# Patient Record
Sex: Male | Born: 1935 | Race: White | Hispanic: No | Marital: Married | Smoking: Former smoker
Health system: Southern US, Community
[De-identification: ages and names within clinical notes are randomized; demographics above are authoritative.]

## PROBLEM LIST (undated history)

## (undated) DIAGNOSIS — G309 Alzheimer's disease, unspecified: Secondary | ICD-10-CM

## (undated) DIAGNOSIS — D469 Myelodysplastic syndrome, unspecified: Secondary | ICD-10-CM

## (undated) DIAGNOSIS — G3184 Mild cognitive impairment, so stated: Secondary | ICD-10-CM

## (undated) DIAGNOSIS — I10 Essential (primary) hypertension: Secondary | ICD-10-CM

## (undated) DIAGNOSIS — I4891 Unspecified atrial fibrillation: Secondary | ICD-10-CM

## (undated) HISTORY — DX: Essential (primary) hypertension: I10

## (undated) HISTORY — DX: Myelodysplastic syndrome, unspecified: D46.9

## (undated) HISTORY — PX: CARDIAC VALVE REPLACEMENT: SHX585

## (undated) HISTORY — DX: Alzheimer's disease, unspecified: G30.9

## (undated) HISTORY — DX: Unspecified atrial fibrillation: I48.91

## (undated) HISTORY — DX: Mild cognitive impairment, so stated: G31.84

---

## 2012-07-16 ENCOUNTER — Ambulatory Visit: Payer: Self-pay | Admitting: Sports Medicine

## 2012-07-16 ENCOUNTER — Encounter: Payer: Self-pay | Admitting: Sports Medicine

## 2012-07-16 ENCOUNTER — Ambulatory Visit (INDEPENDENT_AMBULATORY_CARE_PROVIDER_SITE_OTHER): Payer: Medicare Other | Admitting: Sports Medicine

## 2012-07-16 VITALS — BP 127/78 | HR 65 | Ht 62.0 in | Wt 154.0 lb

## 2012-07-16 DIAGNOSIS — I4891 Unspecified atrial fibrillation: Secondary | ICD-10-CM

## 2012-07-16 DIAGNOSIS — Z299 Encounter for prophylactic measures, unspecified: Secondary | ICD-10-CM

## 2012-07-16 DIAGNOSIS — Z23 Encounter for immunization: Secondary | ICD-10-CM

## 2012-07-16 DIAGNOSIS — Z7901 Long term (current) use of anticoagulants: Secondary | ICD-10-CM | POA: Insufficient documentation

## 2012-07-16 DIAGNOSIS — Z0000 Encounter for general adult medical examination without abnormal findings: Secondary | ICD-10-CM | POA: Insufficient documentation

## 2012-07-16 DIAGNOSIS — I10 Essential (primary) hypertension: Secondary | ICD-10-CM | POA: Insufficient documentation

## 2012-07-16 DIAGNOSIS — Z298 Encounter for other specified prophylactic measures: Secondary | ICD-10-CM

## 2012-07-16 DIAGNOSIS — Z952 Presence of prosthetic heart valve: Secondary | ICD-10-CM | POA: Insufficient documentation

## 2012-07-16 DIAGNOSIS — Z954 Presence of other heart-valve replacement: Secondary | ICD-10-CM

## 2012-07-16 HISTORY — DX: Unspecified atrial fibrillation: I48.91

## 2012-07-16 HISTORY — DX: Essential (primary) hypertension: I10

## 2012-07-16 LAB — POCT INR: INR: 2.7

## 2012-07-16 NOTE — Progress Notes (Signed)
Subjective:    CC: Establish care.   HPI:  Preventive care:  He is due for flu, Tdap, pneumonia shots.  He had his shingles vaccine approximately 5 years ago.  Colonoscopy was approximately 4 years ago.  Hypertension: Well controlled on the current regimen.  Atrial fibrillation: Currently on Coumadin, he is also had a bioprosthetic aortic valve replacement. His son reports that his INRs have been fairly fluctuant. He has not had any episodes of severe bleeding. They have discussed the newer anticoagulants, or unsure as to whether they would like to start it yet or not. His INR today is 2.7, regimen as documented.  Past medical history, Surgical history, Family history, Social history, Allergies, and medications have been entered into the medical record, reviewed, and no changes needed.   Review of Systems: No headache, visual changes, nausea, vomiting, diarrhea, constipation, dizziness, abdominal pain, skin rash, fevers, chills, night sweats, weight loss, chest pain, body aches, joint swelling, muscle aches, or shortness of breath.   Objective:    General: Well Developed, well nourished, and in no acute distress.  Neuro: Alert and oriented x3, extra-ocular muscles intact.  HEENT: Normocephalic, atraumatic, pupils equal round reactive to light, neck supple, no masses, no lymphadenopathy, thyroid nonpalpable. Ears canals clear. Skin: Warm and dry, no rashes noted.  Cardiac: Irregularly irregular rate and rhythm, 3/6 systolic murmur at the left upper sternal border. Respiratory: Clear to auscultation bilaterally. Not using accessory muscles, speaking in full sentences.  Abdominal: Soft, nontender, nondistended, positive bowel sounds, no masses, no organomegaly.  Musculoskeletal: Shoulder, elbow, wrist, hip, knee, ankle stable, and with full range of motion.  Impression and Recommendations:

## 2012-07-16 NOTE — Assessment & Plan Note (Signed)
Currently therapeutic. No changes in his regimen.

## 2012-07-16 NOTE — Assessment & Plan Note (Addendum)
Continue current medications. Checking some CBC, CMET, direct LDL as he is not fasting.

## 2012-07-16 NOTE — Assessment & Plan Note (Signed)
We discussed switching to xarelto today. They will think about it. I think this is prudent, as his INR is tend to fluctuate wildly.

## 2012-07-16 NOTE — Assessment & Plan Note (Signed)
Flu shot, Pneumovax. Shingles vaccine approximately 5 years ago. Tdap is unfortunately on back order. Checking CBC, CMET, direct LDL.  I would like him to come back to see me in 2-3 weeks for a welcome to Medicare visit. I would like to do a Mini-Mental Status exam, EKG, aortic ultrasound as he has been smoking, vision, and hearing.

## 2012-07-17 ENCOUNTER — Encounter: Payer: Self-pay | Admitting: Sports Medicine

## 2012-07-17 LAB — COMPREHENSIVE METABOLIC PANEL
ALT: 20 U/L (ref 0–53)
CO2: 26 mEq/L (ref 19–32)
Calcium: 9.4 mg/dL (ref 8.4–10.5)
Chloride: 102 mEq/L (ref 96–112)
Creat: 1.28 mg/dL (ref 0.50–1.35)
Glucose, Bld: 97 mg/dL (ref 70–99)

## 2012-07-17 LAB — COMPREHENSIVE METABOLIC PANEL WITH GFR
AST: 24 U/L (ref 0–37)
Albumin: 4.5 g/dL (ref 3.5–5.2)
Alkaline Phosphatase: 65 U/L (ref 39–117)
BUN: 25 mg/dL — ABNORMAL HIGH (ref 6–23)
Potassium: 4.7 meq/L (ref 3.5–5.3)
Sodium: 136 meq/L (ref 135–145)
Total Bilirubin: 0.6 mg/dL (ref 0.3–1.2)
Total Protein: 7.2 g/dL (ref 6.0–8.3)

## 2012-07-17 LAB — LDL CHOLESTEROL, DIRECT: Direct LDL: 83 mg/dL

## 2012-07-17 LAB — CBC
HCT: 33.8 % — ABNORMAL LOW (ref 39.0–52.0)
Hemoglobin: 11.2 g/dL — ABNORMAL LOW (ref 13.0–17.0)
MCH: 32.5 pg (ref 26.0–34.0)
MCHC: 33.1 g/dL (ref 30.0–36.0)
MCV: 98 fL (ref 78.0–100.0)
Platelets: 156 10*3/uL (ref 150–400)
RBC: 3.45 MIL/uL — ABNORMAL LOW (ref 4.22–5.81)
RDW: 13.7 % (ref 11.5–15.5)
WBC: 6.4 10*3/uL (ref 4.0–10.5)

## 2012-07-29 ENCOUNTER — Encounter: Payer: Self-pay | Admitting: *Deleted

## 2012-07-29 ENCOUNTER — Telehealth: Payer: Self-pay | Admitting: *Deleted

## 2012-07-29 ENCOUNTER — Encounter: Payer: Self-pay | Admitting: Sports Medicine

## 2012-07-29 ENCOUNTER — Ambulatory Visit (INDEPENDENT_AMBULATORY_CARE_PROVIDER_SITE_OTHER): Payer: Medicare Other | Admitting: Sports Medicine

## 2012-07-29 VITALS — BP 149/79 | HR 62 | Wt 154.0 lb

## 2012-07-29 DIAGNOSIS — I4891 Unspecified atrial fibrillation: Secondary | ICD-10-CM

## 2012-07-29 DIAGNOSIS — Z0000 Encounter for general adult medical examination without abnormal findings: Secondary | ICD-10-CM

## 2012-07-29 DIAGNOSIS — G3184 Mild cognitive impairment, so stated: Secondary | ICD-10-CM

## 2012-07-29 DIAGNOSIS — Z299 Encounter for prophylactic measures, unspecified: Secondary | ICD-10-CM

## 2012-07-29 DIAGNOSIS — Z298 Encounter for other specified prophylactic measures: Secondary | ICD-10-CM

## 2012-07-29 DIAGNOSIS — G309 Alzheimer's disease, unspecified: Secondary | ICD-10-CM

## 2012-07-29 DIAGNOSIS — G459 Transient cerebral ischemic attack, unspecified: Secondary | ICD-10-CM | POA: Insufficient documentation

## 2012-07-29 DIAGNOSIS — Z2989 Encounter for other specified prophylactic measures: Secondary | ICD-10-CM

## 2012-07-29 DIAGNOSIS — F0670 Mild neurocognitive disorder due to known physiological condition without behavioral disturbance: Secondary | ICD-10-CM | POA: Insufficient documentation

## 2012-07-29 DIAGNOSIS — Z23 Encounter for immunization: Secondary | ICD-10-CM

## 2012-07-29 HISTORY — DX: Alzheimer's disease, unspecified: G30.9

## 2012-07-29 MED ORDER — DONEPEZIL HCL 5 MG PO TABS: 5.0000 mg | ORAL_TABLET | Freq: Every day | ORAL | Status: DC

## 2012-07-29 MED ORDER — RIVAROXABAN 20 MG PO TABS: 20.0000 mg | ORAL_TABLET | Freq: Every day | ORAL | Status: DC

## 2012-07-29 NOTE — Assessment & Plan Note (Addendum)
Switching to xarelto.  Risks and benefits explained including decreased risk of both life-threatening, and mild bleeding. We also discussed that there was no antidote at this time. Discontinue Coumadin. Discontinue all INR checks.

## 2012-07-29 NOTE — Assessment & Plan Note (Addendum)
Did very well on Mini-Mental Status exam. Going to obtain a dementia panel. We'll start Aricept. I will see him back in 3 months, and we can increase to 10 mg if needed.

## 2012-07-29 NOTE — Progress Notes (Signed)
  Subjective:    Benjamin Fowler is a 78 y.o. male who presents for a welcome to Medicare exam.   Cardiac risk factors: advanced age (older than 30 for men, 68 for women).  Depression Screen (Note: if answer to either of the following is "Yes", a more complete depression screening is indicated)  Q1: Over the past two weeks, have you felt down, depressed or hopeless? no Q2: Over the past two weeks, have you felt little interest or pleasure in doing things? no  Activities of Daily Living In your present state of health, do you have any difficulty performing the following activities?:  Preparing food and eating?: No Bathing yourself: No Getting dressed: No Using the toilet:No Moving around from place to place: No In the past year have you fallen or had a near fall?:No  Current exercise habits: walks  Dietary issues discussed: Yes  Hearing difficulties: No Safe in current home environment: yes  The would also like to discuss:  Cognitive impairment: Benjamin Fowler does tend to forget things quite often, he does most of his finances, he does tend to hallucinate at night, he sees demons. Otherwise, he does all of his other activities of daily living by himself.  Facial droop: This happens infrequently, but his son recognizes this as he does tend to drool occasionally.  He also notes that his speech slurs. He is wondering if we can look into a stroke workup.  Atrial fibrillation: Currently anticoagulated with Coumadin, they do desire to switch to xarelto.    Review of Systems A comprehensive review of systems was negative.    Objective:     Blood pressure 149/79, pulse 62, weight 154 lb (69.854 kg). There is no height on file to calculate BMI. General Appearance: Alert, well developed and well nourished, cooperative, no distress.  Head: Normocephalic, no obvious abnormality  Eyes: PERRL, EOM's intact, conjunctiva and corneas clear.  Nose: Nares symmetrical, septum midline, mucosa pink,  clear watery discharge; no sinus tenderness  Throat: Lips, tongue, and mucosa are moist, pink, and intact; teeth intact  Neck: Supple, symmetrical, trachea midline, no adenopathy; thyroid: no enlargement, symmetric,no tenderness/mass/nodules; no carotid bruit, no JVD  Back: Symmetrical, no curvature, ROM normal, no CVA tenderness  Chest/Breast: No mass or tenderness  Lungs: Clear to auscultation bilaterally, respirations unlabored  Heart: Normal PMI, no lower extremity edema, regular rate & rhythm, S1 and S2 normal, no murmurs, rubs, or gallops. Abdomen: Soft, non-tender, bowel sounds active all four quadrants, no mass, or organomegaly  Musculoskeletal: All joints, extremities examined, non-tender and unremarkable.  Lymphatic: No adenopathy  Skin/Hair/Nails: Skin warm, dry, and intact, no rashes or abnormal dyspigmentation  Neurologic: Alert and oriented x3, no cranial nerve deficits, normal strength and tone, gait steady   Mini-Mental Status exam: 30     Assessment:       1. Mild cognitive impairment. 2. Possible CVAs. 3. Anticoagulation.    Plan:     During the course of the visit the patient was educated and counseled about appropriate screening and preventive services including:   Td vaccine  Patient Instructions (the written plan) was given to the patient.

## 2012-07-29 NOTE — Assessment & Plan Note (Signed)
Symptoms are concerning. We'll obtain an MRI of the brain. Carotid Dopplers. We'll keep blood pressure under tight control. We will also continue anticoagulation with xarelto.

## 2012-07-30 LAB — CBC
HCT: 34.3 % — ABNORMAL LOW (ref 39.0–52.0)
Hemoglobin: 11.1 g/dL — ABNORMAL LOW (ref 13.0–17.0)
MCH: 32.7 pg (ref 26.0–34.0)
MCHC: 32.4 g/dL (ref 30.0–36.0)
MCV: 101.2 fL — ABNORMAL HIGH (ref 78.0–100.0)
Platelets: 153 K/uL (ref 150–400)
RBC: 3.39 MIL/uL — ABNORMAL LOW (ref 4.22–5.81)
RDW: 14 % (ref 11.5–15.5)
WBC: 5.5 10*3/uL (ref 4.0–10.5)

## 2012-07-30 LAB — FOLATE: Folate: >20 ng/mL

## 2012-07-30 LAB — RPR

## 2012-07-30 LAB — TSH: TSH: 3.722 u[IU]/mL (ref 0.350–4.500)

## 2012-07-30 LAB — VITAMIN B12: Vitamin B-12: 1341 pg/mL — ABNORMAL HIGH (ref 211–911)

## 2012-07-30 LAB — SEDIMENTATION RATE: Sed Rate: 11 mm/hr (ref 0–16)

## 2012-08-18 ENCOUNTER — Ambulatory Visit (HOSPITAL_BASED_OUTPATIENT_CLINIC_OR_DEPARTMENT_OTHER)
Admission: RE | Admit: 2012-08-18 | Discharge: 2012-08-18 | Disposition: A | Discharge status: Home / Self Care | Payer: Medicare Other | Source: Ambulatory Visit | Attending: Sports Medicine | Admitting: Sports Medicine

## 2012-08-18 DIAGNOSIS — G3184 Mild cognitive impairment, so stated: Secondary | ICD-10-CM | POA: Insufficient documentation

## 2012-08-18 DIAGNOSIS — I6529 Occlusion and stenosis of unspecified carotid artery: Secondary | ICD-10-CM | POA: Insufficient documentation

## 2012-08-18 DIAGNOSIS — G319 Degenerative disease of nervous system, unspecified: Secondary | ICD-10-CM | POA: Insufficient documentation

## 2012-08-18 DIAGNOSIS — I10 Essential (primary) hypertension: Secondary | ICD-10-CM | POA: Insufficient documentation

## 2012-08-18 DIAGNOSIS — E237 Disorder of pituitary gland, unspecified: Secondary | ICD-10-CM | POA: Insufficient documentation

## 2012-10-23 ENCOUNTER — Other Ambulatory Visit: Payer: Self-pay | Admitting: *Deleted

## 2012-10-23 DIAGNOSIS — G3184 Mild cognitive impairment, so stated: Secondary | ICD-10-CM

## 2012-10-23 MED ORDER — DONEPEZIL HCL 5 MG PO TABS: 5.0000 mg | ORAL_TABLET | Freq: Every day | ORAL | Status: DC

## 2012-10-23 MED ORDER — ENALAPRIL MALEATE 5 MG PO TABS: 5.0000 mg | ORAL_TABLET | Freq: Two times a day (BID) | ORAL | Status: DC

## 2012-10-29 ENCOUNTER — Encounter: Payer: Self-pay | Admitting: Sports Medicine

## 2012-10-29 ENCOUNTER — Ambulatory Visit (INDEPENDENT_AMBULATORY_CARE_PROVIDER_SITE_OTHER): Payer: Medicare Other | Admitting: Sports Medicine

## 2012-10-29 VITALS — BP 120/67 | HR 84 | Wt 151.0 lb

## 2012-10-29 DIAGNOSIS — G3184 Mild cognitive impairment, so stated: Secondary | ICD-10-CM

## 2012-10-29 DIAGNOSIS — I4891 Unspecified atrial fibrillation: Secondary | ICD-10-CM

## 2012-10-29 DIAGNOSIS — I10 Essential (primary) hypertension: Secondary | ICD-10-CM

## 2012-10-29 MED ORDER — DONEPEZIL HCL 10 MG PO TABS: 10.0000 mg | ORAL_TABLET | Freq: Every day | ORAL | Status: DC

## 2012-10-29 NOTE — Assessment & Plan Note (Signed)
Doing much better with Aricept, less hallucinations at night. I am going to increase his Aricept to 10 mg daily.

## 2012-10-29 NOTE — Assessment & Plan Note (Addendum)
Continue xarelto. Currently euvolemic on exam. He does have a followup with a cardiologist in [place]. He does have what  sounds to be an aortic stenosis murmur.

## 2012-10-29 NOTE — Assessment & Plan Note (Signed)
Well-controlled, no changes needed 

## 2012-10-29 NOTE — Progress Notes (Signed)
Subjective:    CC: Followup  HPI: Atrial fibrillation: He does have a visit coming up with his cardiologist in [place]. He's doing very well on the xarelto, and is enjoying not having to come in for Coumadin checks. He denies any episodes of bleeding.  Hypertension: Doing well with current medications, no adverse effects, needs no refills.  Dementia: Is doing well with Aricept, his son notes that he sees less demons at night, and is somewhat more appropriately interactive.  Past medical history, Surgical history, Family history, Social history, Allergies, and medications have been entered into the medical record, reviewed, and no changes needed.   Review of Systems: No fevers, chills, night sweats, weight loss, chest pain, or shortness of breath.   Objective:    General: Well Developed, well nourished, and in no acute distress.  Neuro: Alert and oriented x3, extra-ocular muscles intact, sensation grossly intact.  HEENT: Normocephalic, atraumatic, pupils equal round reactive to light, neck supple, no masses, no lymphadenopathy, thyroid nonpalpable.  Skin: Warm and dry, no rashes. Cardiac: Irregularly irregular rhythm, normal rate, 2/6 systolic ejection murmur heard over the aorta. No lower extremity edema present. Respiratory: Clear to auscultation bilaterally. Not using accessory muscles, speaking in full sentences.  Impression and Recommendations:

## 2013-01-27 ENCOUNTER — Ambulatory Visit: Payer: Medicare Other | Admitting: Sports Medicine

## 2013-01-29 ENCOUNTER — Ambulatory Visit (INDEPENDENT_AMBULATORY_CARE_PROVIDER_SITE_OTHER): Payer: Medicare Other | Admitting: Sports Medicine

## 2013-01-29 ENCOUNTER — Encounter: Payer: Self-pay | Admitting: Sports Medicine

## 2013-01-29 VITALS — BP 134/80 | HR 62 | Wt 148.0 lb

## 2013-01-29 DIAGNOSIS — I4891 Unspecified atrial fibrillation: Secondary | ICD-10-CM

## 2013-01-29 DIAGNOSIS — G3184 Mild cognitive impairment, so stated: Secondary | ICD-10-CM

## 2013-01-29 DIAGNOSIS — I10 Essential (primary) hypertension: Secondary | ICD-10-CM

## 2013-01-29 MED ORDER — CITALOPRAM HYDROBROMIDE 20 MG PO TABS: 20.0000 mg | ORAL_TABLET | Freq: Every day | ORAL | Status: DC

## 2013-01-29 MED ORDER — RIVAROXABAN 20 MG PO TABS: 20.0000 mg | ORAL_TABLET | Freq: Every day | ORAL | Status: DC

## 2013-01-29 MED ORDER — METOPROLOL TARTRATE 25 MG PO TABS: 25.0000 mg | ORAL_TABLET | Freq: Two times a day (BID) | ORAL | Status: DC

## 2013-01-29 MED ORDER — ENALAPRIL MALEATE 5 MG PO TABS: 5.0000 mg | ORAL_TABLET | Freq: Two times a day (BID) | ORAL | Status: DC

## 2013-01-29 NOTE — Assessment & Plan Note (Addendum)
Continue Aricept, no changes. Son does feel as though his father is depressed, I am adding a low dose of Celexa. Come back in 2-4 weeks to reassess.  Certainly we can also consider the addition of Namenda.

## 2013-01-29 NOTE — Patient Instructions (Addendum)
Dementia Dementia is a general term for problems with brain function. A person with dementia has memory loss and a hard time with at least one other brain function such as thinking, speaking, or problem solving. Dementia can affect social functioning, how you do your job, your mood, or your personality. The changes may be hidden for a long time. The earliest forms of this disease are usually not detected by family or friends. Dementia can be:  Irreversible.  Potentially reversible.  Partially reversible.  Progressive. This means it can get worse over time. CAUSES  Irreversible dementia causes may include:  Degeneration of brain cells (Alzheimer's disease or lewy body dementia).  Multiple small strokes (vascular dementia).  Infection (chronic meningitis or Creutzfelt-Jakob disease).  Frontotemporal dementia. This affects younger people, age 40 to 70, compared to those who have Alzheimer's disease.  Dementia associated with other disorders like Parkinson's disease, Huntington's disease, or HIV-associated dementia. Potentially or partially reversible dementia causes may include:  Medicines.  Metabolic causes such as excessive alcohol intake, vitamin B12 deficiency, or thyroid disease.  Masses or pressure in the brain such as a tumor, blood clot, or hydrocephalus. SYMPTOMS  Symptoms are often hard to detect. Family members or coworkers may not notice them early in the disease process. Different people with dementia may have different symptoms. Symptoms can include:  A hard time with memory, especially recent memory. Long-term memory may not be impaired.  Asking the same question multiple times or forgetting something someone just said.  A hard time speaking your thoughts or finding certain words.  A hard time solving problems or performing familiar tasks (such as how to use a telephone).  Sudden changes in mood.  Changes in personality, especially increasing moodiness or  mistrust.  Depression.  A hard time understanding complex ideas that were never a problem in the past. DIAGNOSIS  There are no specific tests for dementia.   Your caregiver may recommend a thorough evaluation. This is because some forms of dementia can be reversible. The evaluation will likely include a physical exam and getting a detailed history from you and a family member. The history often gives the best clues and suggestions for a diagnosis.  Memory testing may be done. A detailed brain function evaluation called neuropsychologic testing may be helpful.  Lab tests and brain imaging (such as a CT scan or MRI scan) are sometimes important.  Sometimes observation and re-evaluation over time is very helpful. TREATMENT  Treatment depends on the cause.   If the problem is a vitamin deficiency, it may be helped or cured with supplements.  For dementias such as Alzheimer's disease, medicines are available to stabilize or slow the course of the disease. There are no cures for this type of dementia.  Your caregiver can help direct you to groups, organizations, and other caregivers to help with decisions in the care of you or your loved one. HOME CARE INSTRUCTIONS The care of individuals with dementia is varied and dependent upon the progression of the dementia. The following suggestions are intended for the person living with, or caring for, the person with dementia.  Create a safe environment.  Remove the locks on bathroom doors to prevent the person from accidentally locking himself or herself in.  Use childproof latches on kitchen cabinets and any place where cleaning supplies, chemicals, or alcohol are kept.  Use childproof covers in unused electrical outlets.  Install childproof devices to keep doors and windows secured.  Remove stove knobs or install safety   Use childproof latches on kitchen cabinets and any place where cleaning supplies, chemicals, or alcohol are kept.   Use childproof covers in unused electrical outlets.   Install childproof devices to keep doors and windows secured.   Remove stove knobs or install safety knobs and an automatic shut-off on the stove.   Lower the temperature on water heaters.   Label medicines and keep them  locked up.   Secure knives, lighters, matches, power tools, and guns, and keep these items out of reach.   Keep the house free from clutter. Remove rugs or anything that might contribute to a fall.   Remove objects that might break and hurt the person.   Make sure lighting is good, both inside and outside.   Install grab rails as needed.   Use a monitoring device to alert you to falls or other needs for help.   Reduce confusion.   Keep familiar objects and people around.   Use night lights or dim lights at night.   Label items or areas.   Use reminders, notes, or directions for daily activities or tasks.   Keep a simple, consistent routine for waking, meals, bathing, dressing, and bedtime.   Create a calm, quiet environment.   Place large clocks and calendars prominently.   Display emergency numbers and home address near all telephones.   Use cues to establish different times of the day. An example is to open curtains to let the natural light in during the day.    Use effective communication.   Choose simple words and short sentences.   Use a gentle, calm tone of voice.   Be careful not to interrupt.   If the person is struggling to find a word or communicate a thought, try to provide the word or thought.   Ask one question at a time. Allow the person ample time to answer questions. Repeat the question again if the person does not respond.   Reduce nighttime restlessness.   Provide a comfortable bed.   Have a consistent nighttime routine.   Ensure a regular walking or physical activity schedule. Involve the person in daily activities as much as possible.   Limit napping during the day.   Limit caffeine.   Attend social events that stimulate rather than overwhelm the senses.   Encourage good nutrition and hydration.   Reduce distractions during meal times and snacks.   Avoid foods that are too hot or too cold.   Monitor chewing and swallowing ability.   Continue with routine vision,  hearing, dental, and medical screenings.   Only give over-the-counter or prescription medicines as directed by the caregiver.   Monitor driving abilities. Do not allow the person to drive when safe driving is no longer possible.   Register with an identification program which could provide location assistance in the event of a missing person situation.  SEEK MEDICAL CARE IF:    New behavioral problems start such as moodiness, aggressiveness, or seeing things that are not there (hallucinations).   Any new problem with brain function happens. This includes problems with balance, speech, or falling a lot.   Problems with swallowing develop.   Any symptoms of other illness happen.  Small changes or worsening in any aspect of brain function can be a sign that the illness is getting worse. It can also be a sign of another medical illness such as infection. Seeing a caregiver right away is important.  SEEK IMMEDIATE MEDICAL CARE IF:   

## 2013-01-29 NOTE — Assessment & Plan Note (Signed)
Well controlled, no changes 

## 2013-01-29 NOTE — Progress Notes (Signed)
  Subjective:    CC: Followup  HPI: Hypertension: Well controlled.  Atrial fibrillation: No lightheadedness, palpitations, chest pain, no bleeding with xarelto.  Mild cognitive impairment: Stable on Aricept.  Depressed mood: Was having some sleep disturbances at a prior visit, did have some occasional hallucinations. Today notes that her mood is sometimes depressed, energy level is low, does have some difficulty getting to sleep, and appetite has been decreased for greater than 2 weeks. No suicidal or homicidal ideation.  Past medical history, Surgical history, Family history not pertinant except as noted below, Social history, Allergies, and medications have been entered into the medical record, reviewed, and no changes needed.   Review of Systems: No fevers, chills, night sweats, weight loss, chest pain, or shortness of breath.   Objective:    General: Well Developed, well nourished, and in no acute distress.  Neuro: Alert and oriented x3, extra-ocular muscles intact, sensation grossly intact.  HEENT: Normocephalic, atraumatic, pupils equal round reactive to light, neck supple, no masses, no lymphadenopathy, thyroid nonpalpable.  Skin: Warm and dry, no rashes. Cardiac: Regular rate and rhythm, no murmurs rubs or gallops. No lower extremity edema. Respiratory: Clear to auscultation bilaterally. Not using accessory muscles, speaking in full sentences. Impression and Recommendations:

## 2013-01-29 NOTE — Assessment & Plan Note (Signed)
Continue xarelto, no changes.

## 2013-02-26 ENCOUNTER — Ambulatory Visit: Payer: Medicare Other | Admitting: Sports Medicine

## 2013-05-11 ENCOUNTER — Encounter: Payer: Self-pay | Admitting: Sports Medicine

## 2013-05-11 ENCOUNTER — Ambulatory Visit (INDEPENDENT_AMBULATORY_CARE_PROVIDER_SITE_OTHER): Payer: Medicare Other | Admitting: Sports Medicine

## 2013-05-11 VITALS — BP 158/71 | HR 67 | Wt 146.0 lb

## 2013-05-11 DIAGNOSIS — I4891 Unspecified atrial fibrillation: Secondary | ICD-10-CM

## 2013-05-11 DIAGNOSIS — G3184 Mild cognitive impairment, so stated: Secondary | ICD-10-CM

## 2013-05-11 DIAGNOSIS — I10 Essential (primary) hypertension: Secondary | ICD-10-CM

## 2013-05-11 NOTE — Assessment & Plan Note (Signed)
Elevated, no significant changes today since blood pressure has been well-controlled her previous visits.

## 2013-05-11 NOTE — Progress Notes (Signed)
  Subjective:    CC: Followup  HPI: Mood disorder: Benjamin Fowler stopped his citalopram after 2 days, he feels happy, and has no complaints, his son is amenable to him stopping the medication.  Dementia: Stable on Aricept, he's had no further hallucinations.  Hypertension: Slightly elevated today, no chest pain, visual changes.  Atrial fibrillation: No episodes of bleeding on xarelto.  Past medical history, Surgical history, Family history not pertinant except as noted below, Social history, Allergies, and medications have been entered into the medical record, reviewed, and no changes needed.   Review of Systems: No fevers, chills, night sweats, weight loss, chest pain, or shortness of breath.   Objective:    General: Well Developed, well nourished, and in no acute distress.  Neuro: Alert and oriented x3, extra-ocular muscles intact, sensation grossly intact.  HEENT: Normocephalic, atraumatic, pupils equal round reactive to light, neck supple, no masses, no lymphadenopathy, thyroid nonpalpable.  Skin: Warm and dry, no rashes. Cardiac: Regular rate and rhythm, no murmurs rubs or gallops, no lower extremity edema.  Respiratory: Clear to auscultation bilaterally. Not using accessory muscles, speaking in full sentences. Impression and Recommendations:

## 2013-05-11 NOTE — Assessment & Plan Note (Signed)
Continue Aricept, this is improved his sleep, and nighttime hallucinations significantly. No longer taking citalopram.

## 2013-05-11 NOTE — Assessment & Plan Note (Signed)
Continue xarelto, enalapril, metoprolol.

## 2013-10-25 ENCOUNTER — Other Ambulatory Visit: Payer: Self-pay | Admitting: *Deleted

## 2013-10-25 DIAGNOSIS — G3184 Mild cognitive impairment, so stated: Secondary | ICD-10-CM

## 2013-10-25 MED ORDER — DONEPEZIL HCL 10 MG PO TABS: 10.0000 mg | ORAL_TABLET | Freq: Every day | ORAL | Status: DC

## 2013-11-09 ENCOUNTER — Ambulatory Visit: Payer: Medicare Other | Admitting: Sports Medicine

## 2013-11-25 ENCOUNTER — Encounter: Payer: Self-pay | Admitting: Sports Medicine

## 2013-11-25 ENCOUNTER — Ambulatory Visit (INDEPENDENT_AMBULATORY_CARE_PROVIDER_SITE_OTHER): Payer: Medicare Other | Admitting: Sports Medicine

## 2013-11-25 VITALS — BP 126/69 | HR 67 | Ht 62.0 in | Wt 138.0 lb

## 2013-11-25 DIAGNOSIS — I4891 Unspecified atrial fibrillation: Secondary | ICD-10-CM

## 2013-11-25 DIAGNOSIS — I10 Essential (primary) hypertension: Secondary | ICD-10-CM

## 2013-11-25 DIAGNOSIS — G3184 Mild cognitive impairment, so stated: Secondary | ICD-10-CM

## 2013-11-25 MED ORDER — DONEPEZIL HCL 10 MG PO TABS: 10.0000 mg | ORAL_TABLET | Freq: Every day | ORAL | Status: DC

## 2013-11-25 MED ORDER — RIVAROXABAN 20 MG PO TABS: 20.0000 mg | ORAL_TABLET | Freq: Every day | ORAL | Status: DC

## 2013-11-25 MED ORDER — METOPROLOL TARTRATE 25 MG PO TABS: 25.0000 mg | ORAL_TABLET | Freq: Two times a day (BID) | ORAL | Status: DC

## 2013-11-25 MED ORDER — ENALAPRIL MALEATE 5 MG PO TABS: 5.0000 mg | ORAL_TABLET | Freq: Two times a day (BID) | ORAL | Status: DC

## 2013-11-25 MED ORDER — CALCIUM CARBONATE-VITAMIN D 600-400 MG-UNIT PO TABS: 1.0000 | ORAL_TABLET | Freq: Two times a day (BID) | ORAL | Status: DC

## 2013-11-25 NOTE — Assessment & Plan Note (Signed)
Well controlled, no changes 

## 2013-11-25 NOTE — Assessment & Plan Note (Signed)
Stable, continue current medications. Refilling xarelto.

## 2013-11-25 NOTE — Progress Notes (Signed)
  Subjective:    CC: Followup  HPI: Dementia: Stable and doing extremely well with Aricept, no further hallucinations.  Hypertension: Well controlled, stable.  Atrial fibrillation: Stable on xarelto. Needs refills on everything.  Past medical history, Surgical history, Family history not pertinant except as noted below, Social history, Allergies, and medications have been entered into the medical record, reviewed, and no changes needed.   Review of Systems: No fevers, chills, night sweats, weight loss, chest pain, or shortness of breath.   Objective:    General: Well Developed, well nourished, and in no acute distress.  Neuro: Alert and oriented x3, extra-ocular muscles intact, sensation grossly intact.  HEENT: Normocephalic, atraumatic, pupils equal round reactive to light, neck supple, no masses, no lymphadenopathy, thyroid nonpalpable.  Skin: Warm and dry, no rashes. Cardiac: Regular rate and rhythm, no murmurs rubs or gallops, no lower extremity edema.  Respiratory: Clear to auscultation bilaterally. Not using accessory muscles, speaking in full sentences.  Impression and Recommendations:

## 2013-11-25 NOTE — Assessment & Plan Note (Signed)
Stable, continue Aricept. No further hallucinations at night.

## 2013-11-26 ENCOUNTER — Telehealth: Payer: Self-pay

## 2013-11-26 NOTE — Telephone Encounter (Signed)
No PA is needed for Xarelto. [facility] has been notified. [name],CMA

## 2014-05-26 ENCOUNTER — Ambulatory Visit: Payer: Self-pay | Admitting: Sports Medicine

## 2014-05-31 ENCOUNTER — Ambulatory Visit (INDEPENDENT_AMBULATORY_CARE_PROVIDER_SITE_OTHER): Payer: Medicare Other | Admitting: Sports Medicine

## 2014-05-31 ENCOUNTER — Encounter: Payer: Self-pay | Admitting: Sports Medicine

## 2014-05-31 VITALS — BP 166/68 | HR 55 | Ht 62.5 in | Wt 133.0 lb

## 2014-05-31 DIAGNOSIS — H1130 Conjunctival hemorrhage, unspecified eye: Secondary | ICD-10-CM

## 2014-05-31 DIAGNOSIS — H1132 Conjunctival hemorrhage, left eye: Secondary | ICD-10-CM | POA: Insufficient documentation

## 2014-05-31 DIAGNOSIS — I4891 Unspecified atrial fibrillation: Secondary | ICD-10-CM

## 2014-05-31 DIAGNOSIS — I480 Paroxysmal atrial fibrillation: Secondary | ICD-10-CM

## 2014-05-31 DIAGNOSIS — G3184 Mild cognitive impairment, so stated: Secondary | ICD-10-CM

## 2014-05-31 DIAGNOSIS — I10 Essential (primary) hypertension: Secondary | ICD-10-CM

## 2014-05-31 DIAGNOSIS — Z0000 Encounter for general adult medical examination without abnormal findings: Secondary | ICD-10-CM

## 2014-05-31 MED ORDER — MEMANTINE HCL ER 7 & 14 & 21 &28 MG PO CP24: ORAL_CAPSULE | ORAL | Status: DC

## 2014-05-31 NOTE — Assessment & Plan Note (Signed)
Discontinue Xarelto considering some conjunctival hemorrhage.

## 2014-05-31 NOTE — Assessment & Plan Note (Signed)
Medicare physical today. 

## 2014-05-31 NOTE — Assessment & Plan Note (Signed)
Continue current medications, return in 2 weeks to recheck blood pressure.

## 2014-05-31 NOTE — Progress Notes (Signed)
Subjective:    Benjamin Fowler is a 80 y.o. male who presents for Medicare Annual/Subsequent preventive examination.   Preventive Screening-Counseling & Management  Tobacco History  Smoking status  . Former Smoker  . Types: Pipe  Smokeless tobacco  . Not on file    Problems Prior to Visit 1.   Current Problems (verified) Patient Active Problem List   Diagnosis Date Noted  . Mild cognitive impairment 07/29/2012  . TIA (transient ischemic attack) 07/29/2012  . Atrial fibrillation 07/16/2012  . Hypertension 07/16/2012  . Preventive measure 07/16/2012  . H/O aortic valve replacement 07/16/2012    Medications Prior to Visit Current Outpatient Prescriptions on File Prior to Visit  Medication Sig Dispense Refill  . Calcium Carbonate-Vitamin D (CALTRATE 600+D) 600-400 MG-UNIT per tablet Take 1 tablet by mouth 2 (two) times daily.  180 tablet  3  . donepezil (ARICEPT) 10 MG tablet Take 1 tablet (10 mg total) by mouth at bedtime.  90 tablet  3  . enalapril (VASOTEC) 5 MG tablet Take 1 tablet (5 mg total) by mouth 2 (two) times daily.  180 tablet  3  . metoprolol tartrate (LOPRESSOR) 25 MG tablet Take 1 tablet (25 mg total) by mouth 2 (two) times daily.  180 tablet  3  . Rivaroxaban (XARELTO) 20 MG TABS tablet Take 1 tablet (20 mg total) by mouth daily.  90 tablet  3  . [DISCONTINUED] Calcium Carbonate-Vitamin D (CALTRATE 600+D PO) Take by mouth.       No current facility-administered medications on file prior to visit.    Current Medications (verified) Current Outpatient Prescriptions  Medication Sig Dispense Refill  . Calcium Carbonate-Vitamin D (CALTRATE 600+D) 600-400 MG-UNIT per tablet Take 1 tablet by mouth 2 (two) times daily.  180 tablet  3  . donepezil (ARICEPT) 10 MG tablet Take 1 tablet (10 mg total) by mouth at bedtime.  90 tablet  3  . enalapril (VASOTEC) 5 MG tablet Take 1 tablet (5 mg total) by mouth 2 (two) times daily.  180 tablet  3  . metoprolol tartrate  (LOPRESSOR) 25 MG tablet Take 1 tablet (25 mg total) by mouth 2 (two) times daily.  180 tablet  3  . Rivaroxaban (XARELTO) 20 MG TABS tablet Take 1 tablet (20 mg total) by mouth daily.  90 tablet  3  . [DISCONTINUED] Calcium Carbonate-Vitamin D (CALTRATE 600+D PO) Take by mouth.       No current facility-administered medications for this visit.     Allergies (verified) Review of patient's allergies indicates no known allergies.   PAST HISTORY  Family History No family history on file.  Social History History  Substance Use Topics  . Smoking status: Former Smoker    Types: Pipe  . Smokeless tobacco: Not on file  . Alcohol Use: No    Are there smokers in your home (other than you)?  No  Risk Factors Current exercise habits: Home exercise routine includes walking 1 hrs per day.  Dietary issues discussed: No    Cardiac risk factors: advanced age (older than 49 for men, 44 for women) and hypertension.  Depression Screen (Note: if answer to either of the following is "Yes", a more complete depression screening is indicated)   Q1: Over the past two weeks, have you felt down, depressed or hopeless? No  Q2: Over the past two weeks, have you felt little interest or pleasure in doing things? No  Have you lost interest or pleasure in daily life? No  Do you often feel hopeless? No  Do you cry easily over simple problems? No  Activities of Daily Living In your present state of health, do you have any difficulty performing the following activities?:  Driving? No Managing money?  No Feeding yourself? No Getting from bed to chair? No Climbing a flight of stairs? No Preparing food and eating?: No Bathing or showering? No Getting dressed: No Getting to the toilet? No Using the toilet:No Moving around from place to place: No In the past year have you fallen or had a near fall?:No   Are you sexually active?  No  Do you have more than one partner?  No  Hearing Difficulties: No Do  you often ask people to speak up or repeat themselves? No Do you experience ringing or noises in your ears? No Do you have difficulty understanding soft or whispered voices? No   Do you feel that you have a problem with memory? No  Do you often misplace items? No  Do you feel safe at home?  Yes  Cognitive Testing  Alert? Yes  Normal Appearance?Yes  Oriented to person? Yes  Place? Yes   Time? Yes  Recall of three objects?  Yes  Can perform simple calculations? Yes  Displays appropriate judgment?Yes  Can read the correct time from a watch face?Yes   Advanced Directives have been discussed with the patient? Yes   List the Names of Other Physician/Practitioners you currently use: 1.    Indicate any recent Medical Services you may have received from other than [facility] providers in the past year (date may be approximate).  Immunization History  Administered Date(s) Administered  . Influenza Split 07/16/2012  . Pneumococcal Polysaccharide-23 07/16/2012  . Tdap 07/29/2012    Screening Tests Health Maintenance  Topic Date Due  . Influenza Vaccine  05/21/2014  . Colonoscopy  10/22/2019  . Tetanus/tdap  07/29/2022  . Pneumococcal Polysaccharide Vaccine Age 57 And Over  Completed  . Zostavax  Addressed    All answers were reviewed with the patient and necessary referrals were made:  Aundria Mems, MD   05/31/2014   History reviewed: allergies, current medications, past family history, past medical history, past social history, past surgical history and problem list  Review of Systems A comprehensive review of systems was negative.    Objective:     Vision by Snellen chart: right eye:20/20, left eye:20/20 Blood pressure 161/62, pulse 55, height 5' 2.5" (1.588 m), weight 133 lb (60.328 kg). Body mass index is 23.92 kg/(m^2). General: Well Developed, well nourished, and in no acute distress.  Neuro: Alert and oriented x3, extra-ocular muscles intact, sensation grossly  intact. Cranial nerves II through XII are intact, motor, sensory, and coordinative functions are all intact. HEENT: Normocephalic, atraumatic, pupils equal round reactive to light, neck supple, no masses, no lymphadenopathy, thyroid nonpalpable. Oropharynx, nasopharynx, external ear canals are unremarkable. Left-sided soft conjunctival hemorrhage with preserved vision, is an extensive subconjunctival hemorrhage involving the entire conjunctiva Skin: Warm and dry, no rashes noted.  Cardiac: Regular rate and rhythm, no murmurs rubs or gallops.  Respiratory: Clear to auscultation bilaterally. Not using accessory muscles, speaking in full sentences.  Abdominal: Soft, nontender, nondistended, positive bowel sounds, no masses, no organomegaly.  Musculoskeletal: Shoulder, elbow, wrist, hip, knee, ankle stable, and with full range of motion.     Assessment:     Healthy male with a soft conjunctival hemorrhage.     Plan:     During the course of the visit  the patient was educated and counseled about appropriate screening and preventive services including:   Diet review for nutrition referral? Yes ____  Not Indicated ____   Patient Instructions (the written plan) was given to the patient.  Medicare Attestation I have personally reviewed: The patient's medical and social history Their use of alcohol, tobacco or illicit drugs Their current medications and supplements The patient's functional ability including ADLs,fall risks, home safety risks, cognitive, and hearing and visual impairment Diet and physical activities Evidence for depression or mood disorders  The patient's weight, height, BMI, and visual acuity have been recorded in the chart.  I have made referrals, counseling, and provided education to the patient based on review of the above and I have provided the patient with a written personalized care plan for preventive services.     Aundria Mems, MD   05/31/2014

## 2014-05-31 NOTE — Assessment & Plan Note (Signed)
Considering the extensive nature of subconjunctival hemorrhage we are going to discontinue Xarelto temporarily, and refer to ophthalmology.

## 2014-05-31 NOTE — Progress Notes (Signed)
  Subjective:    CC: Medicaid physical  HPI: Patient is a pleasant 80 year old man with prior history of A-fib, HTN, TIA, Cognitive impairment, aortic valve replacement, who comes to the clinic today for his medicare checkup. He has no complaints and is currently studying [place] and Theology. Denies recent changes to vision and hearing, denies changes in cognition. Asks if we can increase the dose of his donepezil to help him stay sharp). Mentions that he woke up with a dark red eye.  Past medical history, Surgical history, Family history not pertinant except as noted below, Social history, Allergies, and medications have been entered into the medical record, reviewed, and no changes needed.   Review of Systems: No fevers, chills, night sweats, weight loss, chest pain, or shortness of breath.   Objective:    General: Well Developed, well nourished, and in no acute distress.  Neuro: Alert and oriented x3, CN2-12 intact, hearing diminished, vision is poor bilaterally, sensation grossly intact; strength 5/5 in biceps, deltoid, hip, knee. Reflexes normal and equal bilaterally patellar, achiles HEENT: Normocephalic, atraumatic, pupils equal round reactive to light, neck supple, no masses, no lymphadenopathy, thyroid nonpalpable. Left eye has subconjunctival hemorrhage. Cataracts in both eyes. Skin: Warm and dry, no rashes. Cardiac: Irregularly irregular, 3/6 systolic murmur best heard over the right sternal border, no carrotid bruits, no lower extremity edema. 2+ peripheral pulses (radial, post. Tibial)  Respiratory: Clear to auscultation bilaterally. Not using accessory muscles, speaking in full sentences. Abd: Large lipoma inferior to the xiphoid process, normal bowel sounds, no bruits, nontender, no masses, no organomegally. Slightly enlarged AA.  Ultrasound Abdomen: AA found to measure roughly 3cm   Digital Rectal Exam: prostate smooth and nontender; Positive hemeoccult    Impression and  Recommendations:    Patient is a pleasant 80 year old man who seems active and cheerful  Bleeding: suboconjunctival hemorrhage is not a problem in and of itself, but given the positive hemeoccult test, we are going to discontinue Xarelto.  Cognitive impairment: patient has requested an increase in dose on his aricept, since he is already taking the maximum dose, we are starting him on namenda.  We have referred him to ophthomology to discuss his vision problems.  Additionally, his blood pressure was high today; we'll take another measurement in 2 weeks and consider starting medication.

## 2014-05-31 NOTE — Assessment & Plan Note (Addendum)
Continue Aricept, adding Namenda per patient request.

## 2014-06-10 ENCOUNTER — Other Ambulatory Visit: Payer: Self-pay

## 2014-06-10 DIAGNOSIS — G3184 Mild cognitive impairment, so stated: Secondary | ICD-10-CM

## 2014-06-10 MED ORDER — DONEPEZIL HCL 10 MG PO TABS: 10.0000 mg | ORAL_TABLET | Freq: Every day | ORAL | Status: DC

## 2014-06-16 ENCOUNTER — Telehealth: Payer: Self-pay | Admitting: *Deleted

## 2014-06-16 ENCOUNTER — Ambulatory Visit (INDEPENDENT_AMBULATORY_CARE_PROVIDER_SITE_OTHER): Payer: Medicare Other | Admitting: Sports Medicine

## 2014-06-16 VITALS — BP 166/69 | HR 61 | Wt 137.0 lb

## 2014-06-16 DIAGNOSIS — I10 Essential (primary) hypertension: Secondary | ICD-10-CM

## 2014-06-16 DIAGNOSIS — G3184 Mild cognitive impairment, so stated: Secondary | ICD-10-CM

## 2014-06-16 MED ORDER — MEMANTINE HCL ER 28 MG PO CP24: 1.0000 | ORAL_CAPSULE | Freq: Every day | ORAL | Status: DC

## 2014-06-16 MED ORDER — ENALAPRIL MALEATE 10 MG PO TABS: 10.0000 mg | ORAL_TABLET | Freq: Two times a day (BID) | ORAL | Status: DC

## 2014-06-16 NOTE — Telephone Encounter (Signed)
Patient aware. Benjamin Fowler, CMA 

## 2014-06-16 NOTE — Progress Notes (Signed)
   Subjective:    Patient ID: Benjamin Fowler, male    DOB: 11/03/1922, 80 y.o.   MRN: [id number]  HPI Benjamin Fowler reports today for BP check which was elevated. He denies CP and SOB. He voices that he is "running around like crazy" that his wife is in the hospital currently with a broken hip and he is not sleeping well with her gone. Margette Fast, CMA    Review of Systems     Objective:   Physical Exam        Assessment & Plan:

## 2014-06-16 NOTE — Telephone Encounter (Signed)
I have increased his enalapril to 10 mg twice a day, I have also refilled his Namenda. Please let patient know.

## 2014-06-16 NOTE — Assessment & Plan Note (Signed)
Increasing enalapril to 10 mg.

## 2014-06-16 NOTE — Assessment & Plan Note (Signed)
Refilling Namenda.

## 2014-06-16 NOTE — Telephone Encounter (Signed)
While in for BP check his son expressed that they needed a refill for namenda xr sent to pharmacy. ([facility]) he is done with the dosing pack. Is this okay to send? Margette Fast, CMA

## 2014-06-17 ENCOUNTER — Telehealth: Payer: Self-pay

## 2014-06-17 NOTE — Telephone Encounter (Signed)
Nicole Kindred, patient's son, states the Memantine HCl ER 28 MG CP24 cost is 200 dollars a month. He will not be able to afford this medication. Is there something cheaper he could use? Please advise.

## 2014-06-17 NOTE — Telephone Encounter (Signed)
Aricept is the cheapest alternative

## 2014-06-18 NOTE — Telephone Encounter (Signed)
Should patient just continue on Aricept and stop the Namenda due to the cost?

## 2014-06-20 MED ORDER — RIVASTIGMINE 4.6 MG/24HR TD PT24: 4.6000 mg | MEDICATED_PATCH | Freq: Every day | TRANSDERMAL | Status: DC

## 2014-06-20 NOTE — Telephone Encounter (Signed)
Yes stop namena, continue aricept, lets try exelon, its a patch.

## 2014-06-20 NOTE — Telephone Encounter (Signed)
Patients son advised

## 2014-08-17 ENCOUNTER — Ambulatory Visit (INDEPENDENT_AMBULATORY_CARE_PROVIDER_SITE_OTHER): Payer: Medicare Other | Admitting: Sports Medicine

## 2014-08-17 VITALS — Temp 98.2°F

## 2014-08-17 DIAGNOSIS — Z0000 Encounter for general adult medical examination without abnormal findings: Secondary | ICD-10-CM

## 2014-08-17 DIAGNOSIS — Z23 Encounter for immunization: Secondary | ICD-10-CM

## 2014-08-17 DIAGNOSIS — I482 Chronic atrial fibrillation, unspecified: Secondary | ICD-10-CM

## 2014-08-17 NOTE — Assessment & Plan Note (Signed)
Influenza vaccine as above.

## 2014-08-17 NOTE — Progress Notes (Signed)
   Subjective:    Patient ID: Benjamin Fowler, male    DOB: [birth year], 80 y.o.   MRN: [id number]  HPI [name] reported to flu clinic today for his immunization. He was counseled on any side effects of flu shot and denied any recent illness or fever. Benjamin Fowler, RMA    Review of Systems     Objective:   Physical Exam        Assessment & Plan:

## 2014-08-23 ENCOUNTER — Ambulatory Visit: Payer: Medicare Other | Admitting: Sports Medicine

## 2014-09-02 ENCOUNTER — Telehealth: Payer: Self-pay

## 2014-09-02 DIAGNOSIS — I10 Essential (primary) hypertension: Secondary | ICD-10-CM

## 2014-09-02 NOTE — Telephone Encounter (Signed)
Coumadin would be a bad idea in this gentleman. Left try to get him samples, until then he should take 162 mg of aspirin daily.

## 2014-09-02 NOTE — Telephone Encounter (Signed)
Benjamin Fowler is in the doughnut hole is can't afford Xarelto, 400 dollars a month. I called Beverley Fiedler with [employer] [phone] to see if we could get some samples. For some reason Xarelto has been discontinued from his medication list. Should I add it back to his current medication list?

## 2014-09-06 NOTE — Telephone Encounter (Signed)
Patient advised of recommendations.  

## 2014-09-28 NOTE — Telephone Encounter (Signed)
Phylliss Bob, Birch's son, to come by and pick up a 30 free trial of Xarelto. Placed card at the front desk.

## 2014-09-29 NOTE — Telephone Encounter (Signed)
Do they need an Rx?

## 2014-10-25 ENCOUNTER — Other Ambulatory Visit: Payer: Self-pay | Admitting: Sports Medicine

## 2014-10-25 ENCOUNTER — Other Ambulatory Visit: Payer: Self-pay

## 2014-10-25 ENCOUNTER — Telehealth: Payer: Self-pay

## 2014-10-25 MED ORDER — MEMANTINE HCL ER 28 MG PO CP24: 1.0000 | ORAL_CAPSULE | Freq: Every day | ORAL | Status: DC

## 2014-10-25 MED ORDER — RIVAROXABAN 20 MG PO TABS: 20.0000 mg | ORAL_TABLET | Freq: Every day | ORAL | Status: DC

## 2014-10-25 NOTE — Telephone Encounter (Signed)
Done

## 2014-10-25 NOTE — Telephone Encounter (Signed)
Benjamin Fowler states he needs a refill on Namenda. This is not on his current medication list. He has exelon patch instead. Please advise.

## 2014-11-18 ENCOUNTER — Telehealth: Payer: Self-pay

## 2014-11-18 NOTE — Telephone Encounter (Signed)
I do need to see Benjamin Fowler, we do need to check for a GI bleed. He should come in, we can check for occult blood in the stool, and also run an additional test to determine what specific type of anemia he has. Have them set him up to see me for a visit to discuss anemia.

## 2014-11-18 NOTE — Telephone Encounter (Signed)
Son Nicole Kindred) called stating patient went to [facility] hospital/clinic today for first visit in hopes of getting assistance with medication expenses; he then received call from nurse stating patient's Hgb 8.9. Wanted to know if we could suggest vitamin/mineral supplement for anemia. I told him we would need to know more about today's visit at [facility] [place] and what they believed was cause of anemia. He has agreed to call them and have notes from appointment faxed here; we discussed red flags for bleeding; (no longer on anticoagulant per son); and if he cannot get this information soon he should consider appointment with Dr.T.

## 2014-11-18 NOTE — Telephone Encounter (Addendum)
Patient son called stated that patient went to [place] doctor today and patient hemoglobin was 8.9 and was to by [place] doctor to contact his PCP. Patient son wants advise on what he should do and what that means?  Patient son did state that he will [facility] to [facility] and the lab results on what they did. Gerald Kuehl,CMA

## 2014-11-21 ENCOUNTER — Encounter: Payer: Self-pay | Admitting: Sports Medicine

## 2014-11-21 ENCOUNTER — Ambulatory Visit (INDEPENDENT_AMBULATORY_CARE_PROVIDER_SITE_OTHER): Payer: Medicare Other | Admitting: Sports Medicine

## 2014-11-21 VITALS — BP 139/66 | HR 59 | Ht 62.0 in | Wt 141.0 lb

## 2014-11-21 DIAGNOSIS — D6109 Other constitutional aplastic anemia: Secondary | ICD-10-CM

## 2014-11-21 DIAGNOSIS — D469 Myelodysplastic syndrome, unspecified: Secondary | ICD-10-CM | POA: Insufficient documentation

## 2014-11-21 DIAGNOSIS — D649 Anemia, unspecified: Secondary | ICD-10-CM

## 2014-11-21 DIAGNOSIS — D6189 Other specified aplastic anemias and other bone marrow failure syndromes: Secondary | ICD-10-CM

## 2014-11-21 HISTORY — DX: Myelodysplastic syndrome, unspecified: D46.9

## 2014-11-21 NOTE — Telephone Encounter (Signed)
Left detailed message on patient son vm to call the office and schedule a follow up appt for patient to check anemia. [name],CMA

## 2014-11-21 NOTE — Progress Notes (Signed)
  Subjective:    CC: [facility]  HPI: This is a pleasant 81 year old male with a history of anemia, the last time we checked it was approximately 2-1/2 years ago, and he had a hemoglobin in the 11's, MCV was slightly high, recently he did have it checked by his physician with the [place], and was told it was in the eights. I don't have any further information, he was then told to talk to me for further evaluation and definitive treatment. He denies any shortness of breath, early fatigability, no chest pain. No presyncope no orthostatic symptoms. No fevers, chills, night sweats, or weight loss.  Past medical history, Surgical history, Family history not pertinant except as noted below, Social history, Allergies, and medications have been entered into the medical record, reviewed, and no changes needed.   Review of Systems: No fevers, chills, night sweats, weight loss, chest pain, or shortness of breath.   Objective:    General: Well Developed, well nourished, and in no acute distress.  Neuro: Alert and oriented x3, extra-ocular muscles intact, sensation grossly intact.  HEENT: Normocephalic, atraumatic, pupils equal round reactive to light, neck supple, no masses, no lymphadenopathy, thyroid nonpalpable.  Skin: Warm and dry, no rashes. Cardiac: Regular rate and rhythm, no murmurs rubs or gallops, no lower extremity edema.  Respiratory: Clear to auscultation bilaterally. Not using accessory muscles, speaking in full sentences. Rectal: Good tone, Hemoccult negative.  Impression and Recommendations:

## 2014-11-21 NOTE — Assessment & Plan Note (Addendum)
Hemoglobin was 11 approximately 3 years ago, a recent check at the [place] noted hemoglobin down to 8. Hemoccult was negative today. No history of melena, hematochezia, or hematemesis. On further questioning it sounds as though he has a history of bone marrow failure.  We are going to check an anemia panel with a reticulocyte count, and recheck his hemoglobin. My suspicion is that he will probably need Neupogen or erythropoietin, however we will determine this once I got iron studies on board. 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 There is no evidence of splenomegaly on exam. His wife has been in the nursing home for some time, and he has been losing weight so we are also going to check a metabolic panel and a prealbumin.

## 2014-11-22 LAB — RETICULOCYTES
ABS Retic: 43.7 10*3/uL (ref 19.0–186.0)
RBC.: 2.73 MIL/uL — ABNORMAL LOW (ref 4.22–5.81)
Retic Ct Pct: 1.6 % (ref 0.4–2.3)

## 2014-11-22 LAB — CBC WITH DIFFERENTIAL/PLATELET
Basophils Absolute: 0 K/uL (ref 0.0–0.1)
Basophils Relative: 1 % (ref 0–1)
Eosinophils Absolute: 0.1 10*3/uL (ref 0.0–0.7)
Eosinophils Relative: 3 % (ref 0–5)
HCT: 27.3 % — ABNORMAL LOW (ref 39.0–52.0)
Hemoglobin: 8.8 g/dL — ABNORMAL LOW (ref 13.0–17.0)
Lymphocytes Relative: 20 % (ref 12–46)
Lymphs Abs: 0.8 10*3/uL (ref 0.7–4.0)
MCH: 32.2 pg (ref 26.0–34.0)
MCHC: 32.2 g/dL (ref 30.0–36.0)
MCV: 100 fL (ref 78.0–100.0)
MPV: 9.8 fL (ref 8.6–12.4)
Monocytes Absolute: 0.4 10*3/uL (ref 0.1–1.0)
Monocytes Relative: 9 % (ref 3–12)
Neutro Abs: 2.7 10*3/uL (ref 1.7–7.7)
Neutrophils Relative %: 67 % (ref 43–77)
Platelets: 100 10*3/uL — ABNORMAL LOW (ref 150–400)
RBC: 2.73 MIL/uL — ABNORMAL LOW (ref 4.22–5.81)
RDW: 14.8 % (ref 11.5–15.5)
WBC: 4 10*3/uL (ref 4.0–10.5)

## 2014-11-22 LAB — IRON AND TIBC
%SAT: 93 % — ABNORMAL HIGH (ref 20–55)
Iron: 440 ug/dL — ABNORMAL HIGH (ref 42–165)
TIBC: 475 ug/dL — ABNORMAL HIGH (ref 215–435)
UIBC: 35 ug/dL — ABNORMAL LOW (ref 125–400)

## 2014-11-22 LAB — COMPREHENSIVE METABOLIC PANEL WITH GFR
ALT: 41 U/L (ref 0–53)
BUN: 29 mg/dL — ABNORMAL HIGH (ref 6–23)
Creat: 1.16 mg/dL (ref 0.50–1.35)

## 2014-11-22 LAB — COMPREHENSIVE METABOLIC PANEL
AST: 50 U/L — ABNORMAL HIGH (ref 0–37)
Albumin: 4.1 g/dL (ref 3.5–5.2)
Alkaline Phosphatase: 71 U/L (ref 39–117)
CO2: 23 mEq/L (ref 19–32)
Calcium: 9.5 mg/dL (ref 8.4–10.5)
Chloride: 109 mEq/L (ref 96–112)
Glucose, Bld: 83 mg/dL (ref 70–99)
Potassium: 4.5 mEq/L (ref 3.5–5.3)
Sodium: 140 mEq/L (ref 135–145)
Total Bilirubin: 0.5 mg/dL (ref 0.2–1.2)
Total Protein: 6.6 g/dL (ref 6.0–8.3)

## 2014-11-22 LAB — HOMOCYSTEINE: Homocysteine: 12 umol/L (ref 4.0–15.4)

## 2014-11-22 LAB — HEMOCCULT GUIAC POC 1CARD (OFFICE): Fecal Occult Blood, POC: NEGATIVE

## 2014-11-22 LAB — PATHOLOGIST SMEAR REVIEW

## 2014-11-22 LAB — FOLATE: Folate: >20 ng/mL

## 2014-11-22 LAB — PREALBUMIN: Prealbumin: 17.8 mg/dL (ref 17.0–34.0)

## 2014-11-22 LAB — VITAMIN B12: Vitamin B-12: 1749 pg/mL — ABNORMAL HIGH (ref 211–911)

## 2014-11-22 LAB — FERRITIN: Ferritin: 25 ng/mL (ref 22–322)

## 2014-11-22 NOTE — Addendum Note (Signed)
Addended by: Doree Albee on: 11/22/2014 08:16 AM   Modules accepted: Orders

## 2014-11-23 LAB — HEAVY METALS, BLOOD
Arsenic: <3 mcg/L (ref <23)
Lead: <2 ug/dL (ref <10)
Mercury, B: <4 ug/L (ref <=10)

## 2014-11-23 LAB — METHYLMALONIC ACID, SERUM: Methylmalonic Acid, Quant: 194 nmol/L (ref 87–318)

## 2014-11-24 ENCOUNTER — Telehealth: Payer: Self-pay

## 2014-11-24 NOTE — Addendum Note (Signed)
Addended by: Silverio Decamp on: 11/24/2014 08:53 AM   Modules accepted: Orders

## 2014-11-24 NOTE — Telephone Encounter (Signed)
Patient son wanted to know if there was a hepatologist in [place] that his father could go see. Keilan Nichol,CMA

## 2014-11-25 NOTE — Telephone Encounter (Signed)
Hematologist?  Im sure we could find one at [facility].

## 2014-11-28 ENCOUNTER — Telehealth: Payer: Self-pay | Admitting: Sports Medicine

## 2014-11-28 NOTE — Telephone Encounter (Signed)
Advised patient son that appt was needed to follow up on blood work. [name],CMA

## 2014-11-28 NOTE — Telephone Encounter (Signed)
Son, Nicole Kindred called.  He wants to know if his father needed to come to his appt tomorrow because his hematology appt is not until March. I told him that according to notes from last visit, he is coming back from [place] on bloodwork. His phone # is [phone].

## 2014-11-29 ENCOUNTER — Ambulatory Visit (INDEPENDENT_AMBULATORY_CARE_PROVIDER_SITE_OTHER): Payer: Medicare Other | Admitting: Sports Medicine

## 2014-11-29 ENCOUNTER — Encounter: Payer: Self-pay | Admitting: Sports Medicine

## 2014-11-29 VITALS — BP 109/46 | HR 57 | Wt 138.0 lb

## 2014-11-29 DIAGNOSIS — D6182 Myelophthisis: Secondary | ICD-10-CM

## 2014-11-29 NOTE — Assessment & Plan Note (Signed)
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  he does have a referral to hematology with an appointment coming up very soon, I do suspect he will need granulocyte colony-stimulating factor

## 2014-11-29 NOTE — Progress Notes (Signed)
  Subjective:    CC: follow-up  HPI: Anemia: Also noted leukopenia and thrombocytopenia, iron studies were unremarkable, reticulocyte count was relatively low, homocysteine and methylmalonic acid levels were normal, B12 and folate levels were normal, heavy metal screen was negative. He does have an appointment with hematology.  Past medical history, Surgical history, Family history not pertinant except as noted below, Social history, Allergies, and medications have been entered into the medical record, reviewed, and no changes needed.   Review of Systems: No fevers, chills, night sweats, weight loss, chest pain, or shortness of breath.   Objective:    General: Well Developed, well nourished, and in no acute distress.  Neuro: Alert and oriented x3, extra-ocular muscles intact, sensation grossly intact.  HEENT: Normocephalic, atraumatic, pupils equal round reactive to light, neck supple, no masses, no lymphadenopathy, thyroid nonpalpable.  Skin: Warm and dry, no rashes. Cardiac: Regular rate and rhythm, no murmurs rubs or gallops, no lower extremity edema.  Respiratory: Clear to auscultation bilaterally. Not using accessory muscles, speaking in full sentences.  We went over the blood work, and likely follow-up with hematology.  Impression and Recommendations:

## 2014-12-13 ENCOUNTER — Ambulatory Visit: Payer: Medicare Other | Admitting: Sports Medicine

## 2015-01-30 ENCOUNTER — Other Ambulatory Visit: Payer: Self-pay | Admitting: Sports Medicine

## 2015-02-24 ENCOUNTER — Other Ambulatory Visit: Payer: Self-pay | Admitting: Sports Medicine

## 2015-04-12 ENCOUNTER — Telehealth: Payer: Self-pay | Admitting: *Deleted

## 2015-04-12 DIAGNOSIS — I480 Paroxysmal atrial fibrillation: Secondary | ICD-10-CM

## 2015-04-12 DIAGNOSIS — G3184 Mild cognitive impairment, so stated: Secondary | ICD-10-CM

## 2015-04-12 NOTE — Telephone Encounter (Signed)
Why are they switching back to Coumadin? This is a much more dangerous medication with difficult dosing.

## 2015-04-12 NOTE — Telephone Encounter (Signed)
Pt called and wanted to inform Dr. Darene Lamer about switching from xarelto 20 mg back to coumadin. He would like for this Rx to be sent to [facility]. Will fwd to pcp for f/u.Audelia Hives [place]

## 2015-04-12 NOTE — Telephone Encounter (Signed)
Because he wants to. Please send rx. Thank you.Audelia Hives [place]

## 2015-04-13 MED ORDER — MEMANTINE HCL-DONEPEZIL HCL ER 28-10 MG PO CP24: 1.0000 | ORAL_CAPSULE | Freq: Every day | ORAL | Status: DC

## 2015-04-13 MED ORDER — WARFARIN SODIUM 5 MG PO TABS: 5.0000 mg | ORAL_TABLET | Freq: Every day | ORAL | Status: DC

## 2015-04-13 NOTE — Telephone Encounter (Signed)
Continue both of them together for 5 days then stop the Xarelto. He will be following up with [place] for Coumadin checks anyways in a week. Lets switch to [employer]

## 2015-04-13 NOTE — Assessment & Plan Note (Signed)
Per patient request switching back to warfarin. Patient aware of the risks and increasing risk of death from bleeding and mortality with using warfarin over one of the new oral anticoagulants

## 2015-04-13 NOTE — Telephone Encounter (Signed)
Prescription given for Coumadin, return weekly for Coumadin checks. As long as they know that the risk of dying from a bleeding is higher with Coumadin.

## 2015-04-13 NOTE — Telephone Encounter (Signed)
Pt advised of risks and recommendations. Pt stated that he has 2 wks and 2 days left of the xarelto and would like to know how to transition from this medication to the coumadin. He also told me that his Namenda it too expensive it costs him $161.00 and would like to have something that is less expensive sent to his pharmacy. Will fwd to pcp for f/u.Audelia Hives [place]

## 2015-04-14 ENCOUNTER — Telehealth: Payer: Self-pay | Admitting: Sports Medicine

## 2015-04-14 NOTE — Telephone Encounter (Signed)
Received fax for prior authorization on Namzaric capsules sent through cover my meds waiting on authorization. - CF

## 2015-04-17 NOTE — Telephone Encounter (Signed)
Pt informed of recommendations.Benjamin Fowler  

## 2015-04-17 NOTE — Telephone Encounter (Addendum)
Pt informed to take 5mg  qday.Benjamin Fowler [place]

## 2015-04-17 NOTE — Telephone Encounter (Addendum)
Probably says it on the rx... ;-)

## 2015-04-17 NOTE — Telephone Encounter (Signed)
Pt would like to know how much coumadin his is to be taking.Audelia Hives [place]

## 2015-04-18 NOTE — Telephone Encounter (Signed)
Checked on medication through cover my meds and Namzaric has been approved by [facility] from 04/14/2015 - 04/13/2016. - CF

## 2015-05-11 ENCOUNTER — Ambulatory Visit (INDEPENDENT_AMBULATORY_CARE_PROVIDER_SITE_OTHER): Payer: Medicare Other | Admitting: Sports Medicine

## 2015-05-11 DIAGNOSIS — I482 Chronic atrial fibrillation: Secondary | ICD-10-CM | POA: Diagnosis not present

## 2015-05-11 LAB — POCT INR: INR: 1.2

## 2015-05-11 NOTE — Progress Notes (Signed)
Pt advised to continue same dose. Pt has a scheduled physical on 05/25/15. Will recheck INR then.

## 2015-05-25 ENCOUNTER — Encounter: Payer: Self-pay | Admitting: Sports Medicine

## 2015-05-25 ENCOUNTER — Ambulatory Visit (INDEPENDENT_AMBULATORY_CARE_PROVIDER_SITE_OTHER): Payer: Medicare Other | Admitting: Sports Medicine

## 2015-05-25 VITALS — BP 163/63 | HR 52 | Ht 63.0 in | Wt 142.0 lb

## 2015-05-25 DIAGNOSIS — E785 Hyperlipidemia, unspecified: Secondary | ICD-10-CM

## 2015-05-25 DIAGNOSIS — Z23 Encounter for immunization: Secondary | ICD-10-CM

## 2015-05-25 DIAGNOSIS — I480 Paroxysmal atrial fibrillation: Secondary | ICD-10-CM | POA: Diagnosis not present

## 2015-05-25 DIAGNOSIS — Z0000 Encounter for general adult medical examination without abnormal findings: Secondary | ICD-10-CM | POA: Diagnosis not present

## 2015-05-25 DIAGNOSIS — Z952 Presence of prosthetic heart valve: Secondary | ICD-10-CM

## 2015-05-25 DIAGNOSIS — I10 Essential (primary) hypertension: Secondary | ICD-10-CM | POA: Diagnosis not present

## 2015-05-25 DIAGNOSIS — G3184 Mild cognitive impairment, so stated: Secondary | ICD-10-CM

## 2015-05-25 LAB — POCT INR: INR: 2.1

## 2015-05-25 MED ORDER — RIVASTIGMINE TARTRATE 1.5 MG PO CAPS: 1.5000 mg | ORAL_CAPSULE | Freq: Two times a day (BID) | ORAL | Status: DC

## 2015-05-25 NOTE — Assessment & Plan Note (Signed)
Medicare physical today. He is doing extremely well, return to see me in 2 months.

## 2015-05-25 NOTE — Assessment & Plan Note (Signed)
Doing well we do not want to drive his blood pressure too low and his age.

## 2015-05-25 NOTE — Assessment & Plan Note (Signed)
Doing extremely well, INR is 2.1. Return for recheck in one month, he has been avoiding greens and kale. No bleeding.

## 2015-05-25 NOTE — Assessment & Plan Note (Signed)
Did not quite meet criteria for also disease, he is diagnostic of mild cognitive impairment, this has improved significantly with the addition of Namzarik I'm going to add oral Exelon as well, certainly the addition of monoamine oxidase inhibitors could be considered.  Return to see me in 2 months.

## 2015-05-25 NOTE — Progress Notes (Signed)
Subjective:    Benjamin Fowler is a 81 y.o. male who presents for Medicare Annual/Subsequent preventive examination.   Preventive Screening-Counseling & Management  Tobacco History  Smoking status  . Former Smoker  . Types: Pipe  Smokeless tobacco  . Not on file    Problems Prior to Visit 1. Hypertension: Doing well 2. Hyperlipidemia: Checking lipids today. 3. Paroxysmal atrial fibrillation: INR is 2.1 today, patient has been doing a good job avoiding greens. 4. Mild cognitive impairment: Fantastic response to [facility], I did discuss the situation with his son over the phone, he has done very well however his son thinks we do have some room to improve in terms of his memory.  Current Problems (verified) Patient Active Problem List   Diagnosis Date Noted  . 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 Subconjunctival hemorrhage of left eye 05/31/2014  . Mild cognitive impairment 07/29/2012  . TIA (transient ischemic attack) 07/29/2012  . Atrial fibrillation 07/16/2012  . Hypertension 07/16/2012  . Annual physical exam 07/16/2012  . H/O aortic valve replacement 07/16/2012    Medications Prior to Visit Current Outpatient Prescriptions on File Prior to Visit  Medication Sig Dispense Refill  . Calcium Carbonate-Vitamin D (CALTRATE 600+D) 600-400 MG-UNIT per tablet Take 1 tablet by mouth 2 (two) times daily. 180 tablet 3  . enalapril (VASOTEC) 10 MG tablet Take 1 tablet (10 mg total) by mouth 2 (two) times daily. 180 tablet 3  . Memantine HCl-Donepezil HCl (NAMZARIC) 28-10 MG CP24 Take 1 capsule by mouth daily. 30 capsule 11  . metoprolol tartrate (LOPRESSOR) 25 MG tablet TAKE ONE TABLET BY MOUTH TWICE DAILY 180 tablet 0  . metoprolol tartrate (LOPRESSOR) 25 MG tablet TAKE ONE TABLET BY MOUTH TWICE DAILY 180 tablet 0  . warfarin (COUMADIN) 5 MG tablet Take 1 tablet (5 mg total) by mouth daily. 30 tablet 3  . [DISCONTINUED] Calcium Carbonate-Vitamin D (CALTRATE 600+D PO) Take by mouth.     No  current facility-administered medications on file prior to visit.    Current Medications (verified) Current Outpatient Prescriptions  Medication Sig Dispense Refill  . Calcium Carbonate-Vitamin D (CALTRATE 600+D) 600-400 MG-UNIT per tablet Take 1 tablet by mouth 2 (two) times daily. 180 tablet 3  . enalapril (VASOTEC) 10 MG tablet Take 1 tablet (10 mg total) by mouth 2 (two) times daily. 180 tablet 3  . Memantine HCl-Donepezil HCl (NAMZARIC) 28-10 MG CP24 Take 1 capsule by mouth daily. 30 capsule 11  . metoprolol tartrate (LOPRESSOR) 25 MG tablet TAKE ONE TABLET BY MOUTH TWICE DAILY 180 tablet 0  . metoprolol tartrate (LOPRESSOR) 25 MG tablet TAKE ONE TABLET BY MOUTH TWICE DAILY 180 tablet 0  . warfarin (COUMADIN) 5 MG tablet Take 1 tablet (5 mg total) by mouth daily. 30 tablet 3  . [DISCONTINUED] Calcium Carbonate-Vitamin D (CALTRATE 600+D PO) Take by mouth.     No current facility-administered medications for this visit.     Allergies (verified) Review of patient's allergies indicates no known allergies.   PAST HISTORY  Family History No family history on file.  Social History History  Substance Use Topics  . Smoking status: Former Smoker    Types: Pipe  . Smokeless tobacco: Not on file  . Alcohol Use: No    Are there smokers in your home (other than you)?  No  Risk Factors Current exercise habits: Home exercise routine includes walking a few hrs per day.  Dietary issues discussed: YEs   Cardiac risk factors: advanced age (older than 71  for men, 29 for women), dyslipidemia, hypertension and male gender.  Depression Screen (Note: if answer to either of the following is "Yes", a more complete depression screening is indicated)   Q1: Over the past two weeks, have you felt down, depressed or hopeless? No  Q2: Over the past two weeks, have you felt little interest or pleasure in doing things? No  Have you lost interest or pleasure in daily life? No  Do you often feel  hopeless? No  Do you cry easily over simple problems? No  Activities of Daily Living In your present state of health, do you have any difficulty performing the following activities?:  Driving? No Managing money?  No Feeding yourself? No Getting from bed to chair? No Climbing a flight of stairs? No Preparing food and eating?: No Bathing or showering? No Getting dressed: No Getting to the toilet? No Using the toilet:No Moving around from place to place: No In the past year have you fallen or had a near fall?:Yes   Are you sexually active?  No  Do you have more than one partner?  No  Hearing Difficulties: Yes Do you often ask people to speak up or repeat themselves? Yes Do you experience ringing or noises in your ears? No Do you have difficulty understanding soft or whispered voices? Yes   Do you feel that you have a problem with memory? No  Do you often misplace items? No  Do you feel safe at home?  Yes  Cognitive Testing  Alert? Yes  Normal Appearance?Yes  Oriented to person? Yes  Place? Yes   Time? Yes  Recall of three objects?  Yes  Can perform simple calculations? Yes  Displays appropriate judgment?Yes  Can read the correct time from a watch face?Yes   Advanced Directives have been discussed with the patient? No   List the Names of Other Physician/Practitioners you currently use: 1.    Indicate any recent Medical Services you may have received from other than [facility] providers in the past year (date may be approximate).  Immunization History  Administered Date(s) Administered  . Influenza Split 07/16/2012  . Influenza,inj,Quad PF,36+ Mos 08/17/2014  . Pneumococcal Polysaccharide-23 07/16/2012  . Tdap 07/29/2012    Screening Tests Health Maintenance  Topic Date Due  . PNA vac Low Risk Adult (2 of 2 - PCV13) 07/16/2013  . INFLUENZA VACCINE  05/22/2015  . COLONOSCOPY  10/22/2019  . TETANUS/TDAP  07/29/2022  . ZOSTAVAX  Addressed    All answers were  reviewed with the patient and necessary referrals were made:  Aundria Mems, MD   05/25/2015   History reviewed: allergies, current medications, past family history, past medical history, past social history, past surgical history and problem list  Review of Systems A comprehensive review of systems was negative.    Objective:     Vision by Snellen chart:See chart There were no vitals taken for this visit. There is no weight on file to calculate BMI. General: Well Developed, well nourished, and in no acute distress.  Neuro: Alert and oriented x3, extra-ocular muscles intact, sensation grossly intact. Cranial nerves II through XII are intact, motor, sensory, and coordinative functions are all intact. HEENT: Normocephalic, atraumatic, pupils equal round reactive to light, neck supple, no masses, no lymphadenopathy, thyroid nonpalpable. Oropharynx, nasopharynx, external ear canals are unremarkable. Skin: Warm and dry, no rashes noted.  Cardiac: Regular rate and rhythm, no murmurs rubs or gallops.  Respiratory: Clear to auscultation bilaterally. Not using accessory muscles, speaking  in full sentences.  Abdominal: Soft, nontender, nondistended, positive bowel sounds, no masses, no organomegaly.  Musculoskeletal: Shoulder, elbow, wrist, hip, knee, ankle stable, and with full range of motion.     Assessment:     Healthy male      Plan:     During the course of the visit the patient was educated and counseled about appropriate screening and preventive services including:    Pneumococcal vaccine   Nutrition counseling   Diet review for nutrition referral? Yes ____  Not Indicated __x__   Patient Instructions (the written plan) was given to the patient.  Medicare Attestation I have personally reviewed: The patient's medical and social history Their use of alcohol, tobacco or illicit drugs Their current medications and supplements The patient's functional ability including  ADLs,fall risks, home safety risks, cognitive, and hearing and visual impairment Diet and physical activities Evidence for depression or mood disorders  The patient's weight, height, BMI, and visual acuity have been recorded in the chart.  I have made referrals, counseling, and provided education to the patient based on review of the above and I have provided the patient with a written personalized care plan for preventive services.     ___________________________________________ Gwen Her. Dianah Field, M.D., ABFM., CAQSM. Primary Care and [place] Instructor of [place] of [facility] of [employer]

## 2015-05-26 LAB — LIPID PANEL
Cholesterol: 113 mg/dL — ABNORMAL LOW (ref 125–200)
HDL: 49 mg/dL (ref >=40)
LDL Cholesterol: 44 mg/dL (ref <130)
Total CHOL/HDL Ratio: 2.3 ratio (ref <=5.0)
Triglycerides: 99 mg/dL (ref <150)
VLDL: 20 mg/dL (ref <30)

## 2015-05-26 LAB — HEMOGLOBIN A1C
Hgb A1c MFr Bld: 5.9 % — ABNORMAL HIGH (ref <5.7)
Mean Plasma Glucose: 123 mg/dL — ABNORMAL HIGH (ref <117)

## 2015-05-26 LAB — HEPATIC FUNCTION PANEL
ALT: 28 U/L (ref 9–46)
AST: 24 U/L (ref 10–35)
Albumin: 4.2 g/dL (ref 3.6–5.1)
Alkaline Phosphatase: 86 U/L (ref 40–115)
Bilirubin, Direct: 0.1 mg/dL (ref <=0.2)
Indirect Bilirubin: 0.4 mg/dL (ref 0.2–1.2)
Total Bilirubin: 0.5 mg/dL (ref 0.2–1.2)
Total Protein: 6.8 g/dL (ref 6.1–8.1)

## 2015-05-26 LAB — BASIC METABOLIC PANEL
BUN: 30 mg/dL — ABNORMAL HIGH (ref 7–25)
CO2: 25 mmol/L (ref 20–31)
Creat: 1.15 mg/dL — ABNORMAL HIGH (ref 0.70–1.11)
Potassium: 4.2 mmol/L (ref 3.5–5.3)
Sodium: 137 mmol/L (ref 135–146)

## 2015-05-26 LAB — BASIC METABOLIC PANEL WITH GFR
Calcium: 9.7 mg/dL (ref 8.6–10.3)
Chloride: 102 mmol/L (ref 98–110)
Glucose, Bld: 94 mg/dL (ref 65–99)

## 2015-05-30 ENCOUNTER — Encounter: Payer: Self-pay | Admitting: Sports Medicine

## 2015-05-30 ENCOUNTER — Ambulatory Visit (INDEPENDENT_AMBULATORY_CARE_PROVIDER_SITE_OTHER): Payer: Medicare Other | Admitting: Sports Medicine

## 2015-05-30 VITALS — BP 163/71 | HR 57 | Wt 145.0 lb

## 2015-05-30 DIAGNOSIS — I10 Essential (primary) hypertension: Secondary | ICD-10-CM | POA: Diagnosis not present

## 2015-05-30 DIAGNOSIS — D469 Myelodysplastic syndrome, unspecified: Secondary | ICD-10-CM | POA: Diagnosis not present

## 2015-05-30 DIAGNOSIS — G3184 Mild cognitive impairment, so stated: Secondary | ICD-10-CM

## 2015-05-30 NOTE — Assessment & Plan Note (Signed)
Currently working with hematology, they have recommended bone marrow biopsy, he declined, hemoglobin did continue to drop, as well as platelets, he is a candidate for Aranesp injections. He has an appointment with hematology next week.

## 2015-05-30 NOTE — Progress Notes (Signed)
  Subjective:    CC: Follow-up  HPI: Myelodysplastic syndrome: Currently in touch with hematology, he declined bone marrow biopsy, but is a candidate for Aranesp injections. He does have an appointment with hematology next week.  Mild cognitive impairment: Doing extremely well with Namzarik, we added oral Exelon at the last visit but he has not yet started this.  Hypertension: Forgot blood pressure medication this morning, no headaches, visual changes, chest pain.  Past medical history, Surgical history, Family history not pertinant except as noted below, Social history, Allergies, and medications have been entered into the medical record, reviewed, and no changes needed.   Review of Systems: No fevers, chills, night sweats, weight loss, chest pain, or shortness of breath.   Objective:    General: Well Developed, well nourished, and in no acute distress.  Neuro: Alert and oriented x3, extra-ocular muscles intact, sensation grossly intact.  HEENT: Normocephalic, atraumatic, pupils equal round reactive to light, neck supple, no masses, no lymphadenopathy, thyroid nonpalpable.  Skin: Warm and dry, no rashes. Cardiac: Regular rate and rhythm, no murmurs rubs or gallops, no lower extremity edema.  Respiratory: Clear to auscultation bilaterally. Not using accessory muscles, speaking in full sentences.  Impression and Recommendations:    I spent 25 minutes with this patient, greater than 50% was face-to-face time counseling regarding the above diagnoses

## 2015-05-30 NOTE — Assessment & Plan Note (Signed)
Overall doing extremely well with Namzarik, has not yet started oral Exelon

## 2015-05-30 NOTE — Assessment & Plan Note (Signed)
Forgot blood pressure medicine this morning.

## 2015-06-05 ENCOUNTER — Encounter: Payer: Self-pay | Admitting: Sports Medicine

## 2015-06-05 DIAGNOSIS — R7303 Prediabetes: Secondary | ICD-10-CM | POA: Insufficient documentation

## 2015-06-21 ENCOUNTER — Telehealth: Payer: Self-pay

## 2015-06-21 DIAGNOSIS — Z952 Presence of prosthetic heart valve: Secondary | ICD-10-CM

## 2015-06-21 MED ORDER — AMOXICILLIN 500 MG PO TABS: ORAL_TABLET | ORAL | Status: DC

## 2015-06-21 NOTE — Telephone Encounter (Signed)
Patient advised.

## 2015-06-21 NOTE — Telephone Encounter (Signed)
Amoxicillin 2 g should be taken 60 minutes before dental procedure.

## 2015-06-21 NOTE — Telephone Encounter (Signed)
Patient needs antibiotics for pre-medication before he goes to the dentist. Please advise.

## 2015-06-22 ENCOUNTER — Ambulatory Visit (INDEPENDENT_AMBULATORY_CARE_PROVIDER_SITE_OTHER): Payer: Medicare Other | Admitting: Sports Medicine

## 2015-06-22 DIAGNOSIS — Z23 Encounter for immunization: Secondary | ICD-10-CM

## 2015-06-22 DIAGNOSIS — I482 Chronic atrial fibrillation: Secondary | ICD-10-CM | POA: Diagnosis not present

## 2015-06-22 LAB — POCT INR: INR: 1.8

## 2015-06-22 NOTE — Progress Notes (Signed)
Pt advised of dosage change, f/u appt scheduled. Dosage change was also explained to Pt's son, who was visiting at the time of our call. Verbalized understating.

## 2015-07-06 ENCOUNTER — Ambulatory Visit (INDEPENDENT_AMBULATORY_CARE_PROVIDER_SITE_OTHER): Payer: Medicare Other | Admitting: Sports Medicine

## 2015-07-06 DIAGNOSIS — I482 Chronic atrial fibrillation: Secondary | ICD-10-CM | POA: Diagnosis not present

## 2015-07-06 LAB — POCT INR: INR: 1.5

## 2015-07-12 ENCOUNTER — Other Ambulatory Visit: Payer: Self-pay | Admitting: Sports Medicine

## 2015-07-13 NOTE — Telephone Encounter (Signed)
DR. T. PATIENT WOULD LIKE  A REFILL ON AMOXICILLIN. PATIENT STATED THAT HE HAS A PROCEDURE AT THE [facility] THIS MORNING. PHARMACY CALLED THIS MORNING TO VERIFY IF IT WOULD BE REFILLED. PLEASE ADVISE. [name],CMA

## 2015-07-24 ENCOUNTER — Encounter: Payer: Self-pay | Admitting: Sports Medicine

## 2015-07-24 ENCOUNTER — Ambulatory Visit (INDEPENDENT_AMBULATORY_CARE_PROVIDER_SITE_OTHER): Payer: Medicare Other | Admitting: Sports Medicine

## 2015-07-24 VITALS — BP 118/47 | HR 49 | Wt 139.0 lb

## 2015-07-24 DIAGNOSIS — G309 Alzheimer's disease, unspecified: Secondary | ICD-10-CM

## 2015-07-24 DIAGNOSIS — F0670 Mild neurocognitive disorder due to known physiological condition without behavioral disturbance: Secondary | ICD-10-CM

## 2015-07-24 DIAGNOSIS — I480 Paroxysmal atrial fibrillation: Secondary | ICD-10-CM | POA: Diagnosis not present

## 2015-07-24 DIAGNOSIS — G3184 Mild cognitive impairment, so stated: Principal | ICD-10-CM

## 2015-07-24 LAB — POCT INR: INR: 1.6

## 2015-07-24 MED ORDER — FLUTICASONE PROPIONATE 50 MCG/ACT NA SUSP: NASAL | Status: DC

## 2015-07-24 NOTE — Patient Instructions (Signed)
Increase warfarin to 7.5 mg on Tuesdays, Fridays, sundays, 5 mg other days, recheck in 2 weeks

## 2015-07-24 NOTE — Assessment & Plan Note (Signed)
Doing extremely well with Exelon and Namzarik. No changes in dosage however we do have room to increase Exelon to 6 mg twice a day. He is having some rhinorrhea likely secondary to acetylcholinesterase inhibition from the 2 medications. We are going to add intranasal steroids for this.

## 2015-07-24 NOTE — Assessment & Plan Note (Signed)
Rechecking INR, low at the last visit.

## 2015-07-24 NOTE — Progress Notes (Signed)
  Subjective:    CC:  Follow-up  HPI: Dementia: Completely resolved now with excellent focality on low-dose Exelon as well as Namzarik.  Atrial fibrillation: Declines use of the newer anti-coags, INR is low today.  Past medical history, Surgical history, Family history not pertinant except as noted below, Social history, Allergies, and medications have been entered into the medical record, reviewed, and no changes needed.   Review of Systems: No fevers, chills, night sweats, weight loss, chest pain, or shortness of breath.   Objective:    General: Well Developed, well nourished, and in no acute distress.  Neuro: Alert and oriented x3, extra-ocular muscles intact, sensation grossly intact.  HEENT: Normocephalic, atraumatic, pupils equal round reactive to light, neck supple, no masses, no lymphadenopathy, thyroid nonpalpable.  Skin: Warm and dry, no rashes. Cardiac: Regular rate and rhythm, no murmurs rubs or gallops, no lower extremity edema.  Respiratory: Clear to auscultation bilaterally. Not using accessory muscles, speaking in full sentences.  Impression and Recommendations:   I spent 25 minutes with this patient, greater than 50% was face-to-face time counseling regarding the above diagnoses

## 2015-08-03 ENCOUNTER — Telehealth: Payer: Self-pay

## 2015-08-03 NOTE — Telephone Encounter (Signed)
That is correct, he should be on Excelon and Namzarik, no more Aricept.

## 2015-08-03 NOTE — Telephone Encounter (Signed)
[name] pharmacist with [name] called stated that patient is taking Namzaric and Aricept and she was thinking that patient shouldn't be taking Aricept once he was prescribed the Namzaric, patient was given a 90 day supply before he was switched to Namzaric.Benjamin Fowler Please advise. [name],CMA

## 2015-08-04 NOTE — Telephone Encounter (Signed)
Spoke to patient son and advised him of this information. Vittoria Noreen,CMA

## 2015-08-07 ENCOUNTER — Encounter: Payer: Self-pay | Admitting: Sports Medicine

## 2015-08-07 ENCOUNTER — Ambulatory Visit (INDEPENDENT_AMBULATORY_CARE_PROVIDER_SITE_OTHER): Payer: Medicare Other | Admitting: Sports Medicine

## 2015-08-07 ENCOUNTER — Ambulatory Visit: Payer: Medicare Other

## 2015-08-07 VITALS — BP 142/47 | HR 53

## 2015-08-07 DIAGNOSIS — I482 Chronic atrial fibrillation, unspecified: Secondary | ICD-10-CM

## 2015-08-07 DIAGNOSIS — G4700 Insomnia, unspecified: Secondary | ICD-10-CM | POA: Diagnosis not present

## 2015-08-07 DIAGNOSIS — F0390 Unspecified dementia without behavioral disturbance: Secondary | ICD-10-CM

## 2015-08-07 LAB — POCT INR: INR: 1.5

## 2015-08-07 MED ORDER — APIXABAN 5 MG PO TABS: 5.0000 mg | ORAL_TABLET | Freq: Two times a day (BID) | ORAL | Status: DC

## 2015-08-07 NOTE — Progress Notes (Signed)
  Subjective:    CC:   HPI:  Past medical history, Surgical history, Family history not pertinant except as noted below, Social history, Allergies, and medications have been entered into the medical record, reviewed, and no changes needed.   Review of Systems: No fevers, chills, night sweats, weight loss, chest pain, or shortness of breath.   Objective:    General: Well Developed, well nourished, and in no acute distress.  Neuro: Alert and oriented x3, extra-ocular muscles intact, sensation grossly intact.  HEENT: Normocephalic, atraumatic, pupils equal round reactive to light, neck supple, no masses, no lymphadenopathy, thyroid nonpalpable.  Skin: Warm and dry, no rashes. Cardiac: Regular rate and rhythm, no murmurs rubs or gallops, no lower extremity edema.  Respiratory: Clear to auscultation bilaterally. Not using accessory muscles, speaking in full sentences.   Impression and Recommendations:    1. Insomnia: Doing well with Benadryl, no increase in dementia symptoms.  2. Atrial fibrillation: Amenable to switch to one of the newer anticoagulants, we have had great difficulty getting his INR in the appropriate range. We are going to start Eliquis.  3. Dementia/Alzheimiers disease: Extremely well controlled with Namzarik and Exelon.  ___________________________________________ Gwen Her. Dianah Field, M.D., ABFM., CAQSM. Primary Care and [place] Instructor of [place] of [facility] of Medicine

## 2015-08-08 ENCOUNTER — Telehealth: Payer: Self-pay

## 2015-08-08 DIAGNOSIS — I480 Paroxysmal atrial fibrillation: Secondary | ICD-10-CM

## 2015-08-08 MED ORDER — DABIGATRAN ETEXILATE MESYLATE 150 MG PO CAPS: 150.0000 mg | ORAL_CAPSULE | Freq: Two times a day (BID) | ORAL | Status: DC

## 2015-08-08 NOTE — Telephone Encounter (Signed)
The Eliquis is $160. The cost is too much.

## 2015-08-08 NOTE — Telephone Encounter (Signed)
Patients son notified of medication change to Pradaxa.

## 2015-08-08 NOTE — Telephone Encounter (Signed)
Switching to Pradaxa, let us know how much that one costs.

## 2015-08-10 MED ORDER — WARFARIN SODIUM 5 MG PO TABS: ORAL_TABLET | ORAL | Status: DC

## 2015-08-10 NOTE — Telephone Encounter (Signed)
Damn, ok calling back in coumadin, he will do 7.5mg  on Monday AND Thursday and 5mg  other days to start.  Calling in some. Return for INR check in 2 weeks.

## 2015-08-10 NOTE — Telephone Encounter (Signed)
Pt called and said the Pradaxa is expensive too. Wants to know if he can just go back on coumadin. Will route for review.

## 2015-08-10 NOTE — Assessment & Plan Note (Signed)
All NOACs are unaffordable for this patient, returning to coumadin, 7.5mg  Monday and Thursday, 5mg  other days, goal 2.0-3.0, recheck in 2 weeks.

## 2015-08-10 NOTE — Telephone Encounter (Signed)
Called and spoke with Pt's husband, Nicole Kindred. Advised of Rx change and new dosage instructions. Nicole Kindred was able to provide correct read back of directions and states he will advised the Pt of how he is to take the coumadin. Follow up for INR nurse visit was scheduled, Pt will be advised of this appointment.

## 2015-08-21 ENCOUNTER — Other Ambulatory Visit: Payer: Self-pay | Admitting: Sports Medicine

## 2015-08-25 ENCOUNTER — Ambulatory Visit (INDEPENDENT_AMBULATORY_CARE_PROVIDER_SITE_OTHER): Payer: Medicare Other | Admitting: Sports Medicine

## 2015-08-25 VITALS — BP 115/54 | HR 54

## 2015-08-25 DIAGNOSIS — I4891 Unspecified atrial fibrillation: Secondary | ICD-10-CM | POA: Diagnosis not present

## 2015-08-25 LAB — POCT INR: INR: 1.7

## 2015-08-25 NOTE — Progress Notes (Signed)
Advised patient's son of recommendations.

## 2015-08-31 ENCOUNTER — Other Ambulatory Visit: Payer: Self-pay | Admitting: Sports Medicine

## 2015-08-31 DIAGNOSIS — I10 Essential (primary) hypertension: Secondary | ICD-10-CM

## 2015-08-31 MED ORDER — ENALAPRIL MALEATE 10 MG PO TABS: 10.0000 mg | ORAL_TABLET | Freq: Two times a day (BID) | ORAL | Status: DC

## 2015-09-04 ENCOUNTER — Ambulatory Visit (INDEPENDENT_AMBULATORY_CARE_PROVIDER_SITE_OTHER): Payer: Medicare Other | Admitting: Sports Medicine

## 2015-09-04 ENCOUNTER — Encounter: Payer: Self-pay | Admitting: Sports Medicine

## 2015-09-04 VITALS — BP 147/72 | HR 60

## 2015-09-04 DIAGNOSIS — G309 Alzheimer's disease, unspecified: Secondary | ICD-10-CM

## 2015-09-04 DIAGNOSIS — G3184 Mild cognitive impairment, so stated: Secondary | ICD-10-CM

## 2015-09-04 DIAGNOSIS — I4891 Unspecified atrial fibrillation: Secondary | ICD-10-CM

## 2015-09-04 DIAGNOSIS — F0670 Mild neurocognitive disorder due to known physiological condition without behavioral disturbance: Secondary | ICD-10-CM

## 2015-09-04 DIAGNOSIS — Z954 Presence of other heart-valve replacement: Secondary | ICD-10-CM | POA: Diagnosis not present

## 2015-09-04 DIAGNOSIS — Z952 Presence of prosthetic heart valve: Secondary | ICD-10-CM

## 2015-09-04 LAB — POCT INR: INR: 1.4

## 2015-09-04 NOTE — Assessment & Plan Note (Signed)
Persistently subtherapeutic. Increasing warfarin per flowsheet, recheck in 2 weeks.

## 2015-09-04 NOTE — Assessment & Plan Note (Signed)
Has self discontinued high-dose Namzarik, continues with Exelon patch. He and his son will keep an eye out for worsening of memory.

## 2015-09-04 NOTE — Progress Notes (Signed)
  Subjective:    CC: Follow-up  HPI: Mild cognitive impairment: Well-controlled with Namzarik, Exelon, unfortunately has been having some nausea at the end of the day and discontinued his Namzarik, no issues with memory, clarity, and there has been significant improvement in his nausea.  Atrial fibrillation: Continues to be subtherapeutic on current dose of Coumadin.  Past medical history, Surgical history, Family history not pertinant except as noted below, Social history, Allergies, and medications have been entered into the medical record, reviewed, and no changes needed.   Review of Systems: No fevers, chills, night sweats, weight loss, chest pain, or shortness of breath.   Objective:    General: Well Developed, well nourished, and in no acute distress.  Neuro: Alert and oriented x3, extra-ocular muscles intact, sensation grossly intact.  HEENT: Normocephalic, atraumatic, pupils equal round reactive to light, neck supple, no masses, no lymphadenopathy, thyroid nonpalpable.  Skin: Warm and dry, no rashes. Cardiac: Regular rate and rhythm, no murmurs rubs or gallops, no lower extremity edema.  Respiratory: Clear to auscultation bilaterally. Not using accessory muscles, speaking in full sentences.  Impression and Recommendations:    I spent 25 minutes with this patient, greater than 50% was face-to-face time counseling regarding the above diagnoses

## 2015-09-19 ENCOUNTER — Telehealth: Payer: Self-pay

## 2015-09-19 ENCOUNTER — Ambulatory Visit (INDEPENDENT_AMBULATORY_CARE_PROVIDER_SITE_OTHER): Payer: Medicare Other | Admitting: Sports Medicine

## 2015-09-19 DIAGNOSIS — I4891 Unspecified atrial fibrillation: Secondary | ICD-10-CM | POA: Diagnosis not present

## 2015-09-19 LAB — POCT INR: INR: 1.8

## 2015-09-19 NOTE — Telephone Encounter (Signed)
Pt mentioned at todays nurse visit that he would like your opinion on [facility] Medicare vs [employer] medicare. I advised pt that I would ask you and let him know. Please advise. Thanks.

## 2015-09-19 NOTE — Telephone Encounter (Signed)
Go [employer].

## 2015-09-20 NOTE — Telephone Encounter (Signed)
Pt's son notified

## 2015-10-03 ENCOUNTER — Ambulatory Visit: Payer: Medicare Other | Admitting: Sports Medicine

## 2015-10-09 ENCOUNTER — Ambulatory Visit (INDEPENDENT_AMBULATORY_CARE_PROVIDER_SITE_OTHER): Payer: Medicare Other | Admitting: Sports Medicine

## 2015-10-09 VITALS — BP 160/68 | HR 56 | Temp 97.6°F | Resp 18 | Wt 140.8 lb

## 2015-10-09 DIAGNOSIS — I482 Chronic atrial fibrillation, unspecified: Secondary | ICD-10-CM

## 2015-10-09 NOTE — Assessment & Plan Note (Addendum)
Currently therapeutic with an INR of 2.1. I did have to perform in office venipuncture. Return in one month. For INR check. Currently at 7.5 mg Monday through Saturday and 5 mg on Sunday.

## 2015-10-09 NOTE — Patient Instructions (Signed)
7.5 mg Monday through Saturday and 5 mg on Sunday.

## 2015-10-09 NOTE — Progress Notes (Signed)
  Subjective:    CC: INR check  HPI: His is a pleasant 81 year old male, he is currently on Coumadin, and has been subtherapeutic on previous checks.  He is here for a Coumadin check.  The nurses failed to obtain a fingerstick, so I was called for further evaluation.   Past medical history, Surgical history, Family history not pertinant except as noted below, Social history, Allergies, and medications have been entered into the medical record, reviewed, and no changes needed.   Review of Systems: No fevers, chills, night sweats, weight loss, chest pain, or shortness of breath.   Objective:    General: Well Developed, well nourished, and in no acute distress.  Neuro: Alert and oriented x3, extra-ocular muscles intact, sensation grossly intact.  HEENT: Normocephalic, atraumatic, pupils equal round reactive to light, neck supple, no masses, no lymphadenopathy, thyroid nonpalpable.  Skin: Warm and dry, no rashes. Cardiac: Regular rate and rhythm, no murmurs rubs or gallops, no lower extremity edema.  Respiratory: Clear to auscultation bilaterally. Not using accessory muscles, speaking in full sentences.  Using a syringe and a 25-gauge needle I performed a venipuncture in the right dorsal forearm, I obtained a small amount of blood which was then transferred onto the test strip.  Impression and Recommendations:

## 2015-11-06 ENCOUNTER — Other Ambulatory Visit: Payer: Self-pay | Admitting: Sports Medicine

## 2015-11-06 ENCOUNTER — Ambulatory Visit (INDEPENDENT_AMBULATORY_CARE_PROVIDER_SITE_OTHER): Payer: Medicare Other | Admitting: Sports Medicine

## 2015-11-06 VITALS — BP 135/63 | HR 58

## 2015-11-06 DIAGNOSIS — I482 Chronic atrial fibrillation, unspecified: Secondary | ICD-10-CM

## 2015-11-06 LAB — POCT INR: INR: 2.2

## 2015-11-08 ENCOUNTER — Telehealth: Payer: Self-pay

## 2015-11-08 ENCOUNTER — Encounter: Payer: Self-pay | Admitting: Sports Medicine

## 2015-11-08 MED ORDER — DONEPEZIL HCL 10 MG PO TABS: 10.0000 mg | ORAL_TABLET | Freq: Every day | ORAL | Status: DC

## 2015-11-08 NOTE — Telephone Encounter (Signed)
Continue Exelon, refilling Aricept. I also see that Exelon is on his chart as both topical and pills, please clarify which one he is taking and reconcile the medication list.

## 2015-11-08 NOTE — Telephone Encounter (Signed)
Patient wants a refill on Aricept. Historical provider.   Nicole Kindred [phone]

## 2015-11-08 NOTE — Telephone Encounter (Signed)
Up dated medication list. He son states the Exelon was not with the rest of his medications. He believes he is not taking the Exelon.

## 2015-11-08 NOTE — Telephone Encounter (Signed)
Patient's son advised and he will call back to give me an up dated medication list.

## 2015-11-09 ENCOUNTER — Other Ambulatory Visit: Payer: Self-pay

## 2015-11-09 MED ORDER — DONEPEZIL HCL 10 MG PO TABS: 10.0000 mg | ORAL_TABLET | Freq: Every day | ORAL | Status: DC

## 2015-11-09 NOTE — Telephone Encounter (Signed)
If he believes mental status is stable then we can remove Exelon from the medication list

## 2015-11-09 NOTE — Telephone Encounter (Signed)
Spoke with son and took Exelon off medication list.

## 2015-11-20 ENCOUNTER — Ambulatory Visit: Payer: Medicare Other

## 2015-11-25 ENCOUNTER — Other Ambulatory Visit: Payer: Self-pay | Admitting: Sports Medicine

## 2015-12-07 ENCOUNTER — Ambulatory Visit (INDEPENDENT_AMBULATORY_CARE_PROVIDER_SITE_OTHER): Payer: Medicare Other | Admitting: Sports Medicine

## 2015-12-07 VITALS — BP 169/76 | HR 60

## 2015-12-07 DIAGNOSIS — I4891 Unspecified atrial fibrillation: Secondary | ICD-10-CM | POA: Diagnosis not present

## 2015-12-07 DIAGNOSIS — I482 Chronic atrial fibrillation, unspecified: Secondary | ICD-10-CM

## 2015-12-07 LAB — PROTIME-INR
INR: 4.87 — ABNORMAL HIGH (ref <1.50)
Prothrombin Time: 45.9 s — ABNORMAL HIGH (ref 11.6–15.2)

## 2015-12-07 LAB — POCT INR: INR: 5.6

## 2015-12-11 ENCOUNTER — Telehealth: Payer: Self-pay

## 2015-12-11 NOTE — Telephone Encounter (Signed)
Son notified and appointment scheduled for recheck.

## 2015-12-11 NOTE — Telephone Encounter (Signed)
-----   Message from Silverio Decamp, MD sent at 12/08/2015  1:18 PM EST ----- Please let Reakwon know he should stop his Coumadin until we recheck his levels on Monday.

## 2015-12-14 ENCOUNTER — Ambulatory Visit (INDEPENDENT_AMBULATORY_CARE_PROVIDER_SITE_OTHER): Payer: Medicare Other | Admitting: Sports Medicine

## 2015-12-14 VITALS — BP 162/69 | HR 68

## 2015-12-14 DIAGNOSIS — I482 Chronic atrial fibrillation, unspecified: Secondary | ICD-10-CM

## 2015-12-14 LAB — POCT INR: INR: 2

## 2015-12-14 NOTE — Progress Notes (Signed)
Called x 2, unable to reach patient. Will try again later.

## 2015-12-20 ENCOUNTER — Other Ambulatory Visit: Payer: Self-pay | Admitting: Sports Medicine

## 2015-12-20 ENCOUNTER — Telehealth: Payer: Self-pay

## 2015-12-20 NOTE — Telephone Encounter (Signed)
Patient has a dentist appointment on the16 th of March and again on the 28 th of March. They need to know what his dosing for his coumadin should be.   Higher education careers adviser  Phone - [phone] Fax - [phone]  Son - Edith Kelderman [phone]

## 2015-12-20 NOTE — Telephone Encounter (Signed)
Son advised and faxed letter to dentist.

## 2015-12-20 NOTE — Telephone Encounter (Signed)
He is going for fillings.

## 2015-12-20 NOTE — Telephone Encounter (Signed)
No need to stop Coumadin.

## 2015-12-20 NOTE — Telephone Encounter (Signed)
Is this just for a cleaning? Or an extraction?

## 2015-12-20 NOTE — Progress Notes (Signed)
Patient advised.

## 2016-01-02 DIAGNOSIS — M25611 Stiffness of right shoulder, not elsewhere classified: Secondary | ICD-10-CM | POA: Diagnosis not present

## 2016-01-02 DIAGNOSIS — D539 Nutritional anemia, unspecified: Secondary | ICD-10-CM | POA: Diagnosis not present

## 2016-01-02 DIAGNOSIS — D649 Anemia, unspecified: Secondary | ICD-10-CM | POA: Diagnosis not present

## 2016-01-02 DIAGNOSIS — M25511 Pain in right shoulder: Secondary | ICD-10-CM | POA: Diagnosis not present

## 2016-01-02 DIAGNOSIS — D696 Thrombocytopenia, unspecified: Secondary | ICD-10-CM | POA: Diagnosis not present

## 2016-01-16 ENCOUNTER — Encounter: Payer: Self-pay | Admitting: *Deleted

## 2016-01-16 ENCOUNTER — Emergency Department
Admission: EM | Admit: 2016-01-16 | Discharge: 2016-01-16 | Disposition: A | Discharge status: Home / Self Care | Payer: Medicare Other | Source: Home / Self Care | Attending: Family Medicine | Admitting: Family Medicine

## 2016-01-16 DIAGNOSIS — K068 Other specified disorders of gingiva and edentulous alveolar ridge: Secondary | ICD-10-CM

## 2016-01-16 DIAGNOSIS — K055 Other periodontal diseases: Secondary | ICD-10-CM | POA: Diagnosis not present

## 2016-01-16 NOTE — Telephone Encounter (Signed)
Returned call to son Nicole Kindred, advised if they are holding pressure and the bleeding continues to bring Pt to UC and get the bleeding stopped. Verbalized understanding.

## 2016-01-16 NOTE — ED Provider Notes (Signed)
CSN: [id number]     Arrival date & time 01/16/16  1759 History   First MD Initiated Contact with Patient 01/16/16 1916     Chief Complaint  Patient presents with  . Dental Problem      HPI Comments: Patient had a tooth extracted at noon today in left upper jaw, but has had persistent bleeding.  He takes Coumadin.  He notes that the bleeding is now minimal.  The history is provided by the patient and a relative.    History reviewed. No pertinent past medical history. History reviewed. No pertinent past surgical history. History reviewed. No pertinent family history. Social History  Substance Use Topics  . Smoking status: Former Smoker    Types: Pipe  . Smokeless tobacco: None  . Alcohol Use: No    Review of Systems  Constitutional: Negative for fever, diaphoresis, activity change and fatigue.  HENT: Positive for dental problem.   Eyes: Negative.   Respiratory: Negative for cough, choking and shortness of breath.   Cardiovascular: Negative.   Gastrointestinal: Negative.   Genitourinary: Negative.   Musculoskeletal: Negative.   Skin: Negative.   Neurological: Negative for dizziness, speech difficulty and light-headedness.    Allergies  Chicken allergy and Other  Home Medications   Prior to Admission medications   Medication Sig Start Date End Date Taking? Authorizing Provider  acetaminophen (TYLENOL) 500 MG tablet Take 500 mg by mouth.    Historical Provider, MD  b complex vitamins tablet Take 1 tablet by mouth daily.    Historical Provider, MD  Calcium Carbonate-Vitamin D (CALTRATE 600+D) 600-400 MG-UNIT per tablet Take 1 tablet by mouth 2 (two) times daily. 11/25/13   Silverio Decamp, MD  Cholecalciferol (VITAMIN D PO) Take 200 Units by mouth.    Historical Provider, MD  co-enzyme Q-10 30 MG capsule Take 30 mg by mouth 3 (three) times daily.    Historical Provider, MD  Cyanocobalamin (VITAMIN B 12 PO) Take 1,000 mcg by mouth.    Historical Provider, MD  donepezil  (ARICEPT) 10 MG tablet Take 1 tablet (10 mg total) by mouth at bedtime. 11/09/15   Silverio Decamp, MD  enalapril (VASOTEC) 10 MG tablet Take 1 tablet (10 mg total) by mouth 2 (two) times daily. 08/31/15   Silverio Decamp, MD  Fish Oil OIL 350 mg by Does not apply route.    Historical Provider, MD  fluticasone (FLONASE) 50 MCG/ACT nasal spray One spray in each nostril twice a day, use left hand for right nostril, and right hand for left nostril. 07/24/15   Silverio Decamp, MD  Ginger, Zingiber officinalis, (GINGER ROOT) 550 MG CAPS Take by mouth.    Historical Provider, MD  Glucosamine-Chondroitin 500-400 MG CAPS Take by mouth. Reported on 11/08/2015    Historical Provider, MD  Hawthorn Berries 565 MG CAPS Take by mouth.    Historical Provider, MD  Lutein 10 MG TABS Take by mouth. Reported on 11/08/2015    Historical Provider, MD  Lysine 500 MG TABS Take by mouth.    Historical Provider, MD  magnesium 30 MG tablet Take 30 mg by mouth 2 (two) times daily.    Historical Provider, MD  metoprolol tartrate (LOPRESSOR) 25 MG tablet TAKE ONE TABLET BY MOUTH TWICE DAILY 11/27/15   Silverio Decamp, MD  Multiple Vitamin (MULTIVITAMIN) capsule Take by mouth. Reported on 11/08/2015 01/19/11   Historical Provider, MD  rivastigmine (EXELON) 1.5 MG capsule TAKE ONE CAPSULE BY MOUTH TWICE DAILY 12/20/15  Silverio Decamp, MD  Saw Palmetto 80 MG CAPS Take by mouth. Reported on 11/08/2015    Historical Provider, MD  Turmeric 450 MG CAPS Take by mouth.    Historical Provider, MD  vitamin C (ASCORBIC ACID) 500 MG tablet Take 500 mg by mouth daily.    Historical Provider, MD  vitamin E 400 UNIT capsule Take 400 Units by mouth daily.    Historical Provider, MD  warfarin (COUMADIN) 5 MG tablet Use as directed after INR checks. 08/10/15   Silverio Decamp, MD  Zinc 50 MG CAPS Take by mouth.    Historical Provider, MD   Meds Ordered and Administered this Visit  Medications - No data to  display  BP 142/72 mmHg  Pulse 56  Resp 16  SpO2 98% No data found.   Physical Exam  Constitutional: He is oriented to person, place, and time. He appears well-developed and well-nourished. No distress.  HENT:  Head: Normocephalic.  Nose: Nose normal.  Mouth/Throat:    There is clotted blood and small amount of bleeding present at tooth extraction site #12 in left upper jaw.  Eyes: Pupils are equal, round, and reactive to light.  Neck: Neck supple.  Cardiovascular: Normal heart sounds.   Lymphadenopathy:    He has no cervical adenopathy.  Neurological: He is alert and oriented to person, place, and time.  Skin: Skin is warm and dry. He is not diaphoretic.  Nursing note and vitals reviewed.   ED Course  Procedures Celotex applied to bleeding gingiva and pressure applied, with resolution of bleeding.   MDM   1. Gingival bleeding     Sleep in recliner today with head elevated.  Avoid chewing on left side. If symptoms become significantly worse during the night or over the weekend, proceed to the local emergency room.  Followup with dentist as soon as possible.     Kandra Nicolas, MD 01/22/16 [phone]

## 2016-01-16 NOTE — Telephone Encounter (Signed)
Pt was supposed to only get fillings so he did not stop coumadin. While there, they pulled a tooth. Pt is still bleeding afterwards, son would like to know at what point (if bleeding continues) would he need to take Pt to ED. Will route.

## 2016-01-16 NOTE — ED Notes (Signed)
Sie reporting going to the dentist today for a filling, the dentist advised him the tooth was too decayed to fill. The dentist pulled the tooth at 1200 today. Pt is currently taking Coumadin. He continues to have bleeding.

## 2016-01-16 NOTE — Discharge Instructions (Signed)
Sleep in recliner today with head elevated.  Avoid chewing on left side. If symptoms become significantly worse during the night or over the weekend, proceed to the local emergency room.

## 2016-01-17 ENCOUNTER — Encounter: Payer: Self-pay | Admitting: Sports Medicine

## 2016-01-17 ENCOUNTER — Ambulatory Visit (INDEPENDENT_AMBULATORY_CARE_PROVIDER_SITE_OTHER): Payer: Medicare Other | Admitting: Sports Medicine

## 2016-01-17 VITALS — BP 122/68 | HR 74 | Resp 18 | Wt 135.2 lb

## 2016-01-17 DIAGNOSIS — M25511 Pain in right shoulder: Secondary | ICD-10-CM

## 2016-01-17 DIAGNOSIS — K1379 Other lesions of oral mucosa: Secondary | ICD-10-CM

## 2016-01-17 DIAGNOSIS — M19011 Primary osteoarthritis, right shoulder: Secondary | ICD-10-CM | POA: Insufficient documentation

## 2016-01-17 LAB — POCT INR: INR: 6.3

## 2016-01-17 MED ORDER — PHYTONADIONE 10 MG/ML INJECTION
10.0000 mg | Freq: Once | INTRAMUSCULAR | Status: AC
  Administered 2016-01-17: 10 mg via INTRAMUSCULAR

## 2016-01-17 NOTE — Assessment & Plan Note (Signed)
X-rays will be obtained, who discuss this further at a future visit.

## 2016-01-17 NOTE — Progress Notes (Signed)
  Subjective:    CC: Bleeding gums  HPI: This is a pleasant 82 year old male, he was initially supposed to simply going for a cleaning however they ended up pulling his tooth, he was on Coumadin and has had moderate bleeding since then, the tried compression and overall the bleeding has stopped.  Past medical history, Surgical history, Family history not pertinant except as noted below, Social history, Allergies, and medications have been entered into the medical record, reviewed, and no changes needed.   Review of Systems: No fevers, chills, night sweats, weight loss, chest pain, or shortness of breath.   Objective:    General: Well Developed, well nourished, and in no acute distress.  Neuro: Alert and oriented x3, extra-ocular muscles intact, sensation grossly intact.  HEENT: Normocephalic, atraumatic, pupils equal round reactive to light, neck supple, no masses, no lymphadenopathy, thyroid nonpalpable. Oropharynx is slightly bloody, does appear to be a well-formed hematoma at the site of his first molar in the upper left. Skin: Warm and dry, no rashes. Cardiac: Regular rate and rhythm, no murmurs rubs or gallops, no lower extremity edema.  Respiratory: Clear to auscultation bilaterally. Not using accessory muscles, speaking in full sentences.  INR 6.0 here today, vitamin K 10 mg given intramuscular considering active bleeding.  Impression and Recommendations:

## 2016-01-17 NOTE — Assessment & Plan Note (Addendum)
Initially we suspected patient will simply be having a cleaning, so he continued Coumadin, ultimately he ended up having a tooth pole that resulted in some mild prolonged hemorrhage. Everything is hemostatic right now, considering active bleeding and an INR of 6, we are giving vitamin K 10 mg intramuscular, and reverse his anticoagulation for the next couple of weeks. We will restart it in the future. Return in one week.

## 2016-01-18 ENCOUNTER — Telehealth: Payer: Self-pay

## 2016-01-25 ENCOUNTER — Ambulatory Visit: Payer: Medicare Other | Admitting: Sports Medicine

## 2016-01-26 ENCOUNTER — Encounter: Payer: Self-pay | Admitting: Sports Medicine

## 2016-01-26 ENCOUNTER — Ambulatory Visit (INDEPENDENT_AMBULATORY_CARE_PROVIDER_SITE_OTHER): Payer: Medicare Other

## 2016-01-26 ENCOUNTER — Ambulatory Visit (INDEPENDENT_AMBULATORY_CARE_PROVIDER_SITE_OTHER): Payer: Medicare Other | Admitting: Sports Medicine

## 2016-01-26 VITALS — BP 121/67 | HR 58 | Resp 16 | Wt 135.0 lb

## 2016-01-26 DIAGNOSIS — M25511 Pain in right shoulder: Secondary | ICD-10-CM | POA: Diagnosis not present

## 2016-01-26 DIAGNOSIS — I482 Chronic atrial fibrillation, unspecified: Secondary | ICD-10-CM

## 2016-01-26 DIAGNOSIS — I10 Essential (primary) hypertension: Secondary | ICD-10-CM | POA: Diagnosis not present

## 2016-01-26 DIAGNOSIS — M19011 Primary osteoarthritis, right shoulder: Secondary | ICD-10-CM | POA: Diagnosis not present

## 2016-01-26 DIAGNOSIS — G309 Alzheimer's disease, unspecified: Secondary | ICD-10-CM | POA: Diagnosis not present

## 2016-01-26 DIAGNOSIS — G3184 Mild cognitive impairment, so stated: Secondary | ICD-10-CM

## 2016-01-26 MED ORDER — ASPIRIN 162 MG PO TBEC: 162.0000 mg | DELAYED_RELEASE_TABLET | Freq: Every day | ORAL | Status: AC

## 2016-01-26 MED ORDER — DONEPEZIL HCL 10 MG PO TABS: 10.0000 mg | ORAL_TABLET | Freq: Every day | ORAL | Status: DC

## 2016-01-26 MED ORDER — RIVASTIGMINE TARTRATE 1.5 MG PO CAPS: 1.5000 mg | ORAL_CAPSULE | Freq: Two times a day (BID) | ORAL | Status: DC

## 2016-01-26 MED ORDER — MEMANTINE HCL ER 14 MG PO CP24: 14.0000 mg | ORAL_CAPSULE | Freq: Every day | ORAL | Status: DC

## 2016-01-26 MED ORDER — ENALAPRIL MALEATE 10 MG PO TABS: 10.0000 mg | ORAL_TABLET | Freq: Two times a day (BID) | ORAL | Status: AC

## 2016-01-26 MED ORDER — METOPROLOL TARTRATE 25 MG PO TABS: 25.0000 mg | ORAL_TABLET | Freq: Two times a day (BID) | ORAL | Status: AC

## 2016-01-26 MED ORDER — DONEPEZIL HCL 10 MG PO TABS: 10.0000 mg | ORAL_TABLET | Freq: Every day | ORAL | Status: AC

## 2016-01-26 NOTE — Assessment & Plan Note (Signed)
Has had 2 many episodes of elevated INR, including more recently an episode of severe bleeding from the mouth. At this point we are going to stop Coumadin, I discussed the new or anticoagulants which are not affordable, we are simply going to proceed with aspirin 162 mg daily.

## 2016-01-26 NOTE — Progress Notes (Signed)
  Subjective:    CC: Follow-up  HPI: Right shoulder pain: Diffuse, x-rays did show generalized osteoarthritis, there was a high riding humeral head suggestive of complete rotator cuff tear. He is agreeable to proceed with injection.  Atrial fibrillation: Previous and Coumadin, we've had multiple wide fluctuations in his INR, and more recently had an episode of fairly severe oral bleeding. Considering his unsteadiness, unable to afford newer anticoagulants, and rapid swings and 9 are we have decided to stop Coumadin treatment and use aspirin alone.  Dementia: Doing relatively well on Aricept, rivastigmine, would like to add memantine.  Past medical history, Surgical history, Family history not pertinant except as noted below, Social history, Allergies, and medications have been entered into the medical record, reviewed, and no changes needed.   Review of Systems: No fevers, chills, night sweats, weight loss, chest pain, or shortness of breath.   Objective:    General: Well Developed, well nourished, and in no acute distress.  Neuro: Alert and oriented x3, extra-ocular muscles intact, sensation grossly intact.  HEENT: Normocephalic, atraumatic, pupils equal round reactive to light, neck supple, no masses, no lymphadenopathy, thyroid nonpalpable.  Skin: Warm and dry, no rashes. Cardiac: Regular rate and rhythm, no murmurs rubs or gallops, no lower extremity edema.  Respiratory: Clear to auscultation bilaterally. Not using accessory muscles, speaking in full sentences.  Procedure: Real-time Ultrasound Guided Injection of right glenohumeral joint Device: GE Logiq E  Verbal informed consent obtained.  Time-out conducted.  Noted no overlying erythema, induration, or other signs of local infection.  Skin prepped in a sterile fashion.  Local anesthesia: Topical Ethyl chloride.  With sterile technique and under real time ultrasound guidance:  1 mL Kenalog 40, 2 mL lidocaine, 2 mL Marcaine  injected easily. Completed without difficulty  Pain immediately resolved suggesting accurate placement of the medication.  Advised to call if fevers/chills, erythema, induration, drainage, or persistent bleeding.  Images permanently stored and available for review in the ultrasound unit.  Impression: Technically successful ultrasound guided injection.  Impression and Recommendations:

## 2016-01-26 NOTE — Addendum Note (Signed)
Addended by: Elizabeth Sauer on: 01/26/2016 02:48 PM   Modules accepted: Orders

## 2016-01-26 NOTE — Addendum Note (Signed)
Addended by: Silverio Decamp on: 01/26/2016 02:40 PM   Modules accepted: Orders

## 2016-01-26 NOTE — Assessment & Plan Note (Signed)
Glenohumeral injection as above, return in one month.

## 2016-01-26 NOTE — Assessment & Plan Note (Signed)
Refilling Aricept, rivastigmine, adding memantine.

## 2016-01-30 ENCOUNTER — Telehealth: Payer: Self-pay

## 2016-01-30 MED ORDER — MEMANTINE HCL ER 14 MG PO CP24: 14.0000 mg | ORAL_CAPSULE | Freq: Every day | ORAL | Status: DC

## 2016-01-30 NOTE — Telephone Encounter (Signed)
Notified patient.

## 2016-01-30 NOTE — Telephone Encounter (Signed)
I ordered the generic extended release tablet.  It may be cheaper to use the twice daily form.  If this is not affordable please let me know.

## 2016-01-31 NOTE — Telephone Encounter (Signed)
The cost of the Namenda 14 mg is too much. They would like the generic in 5 mg or 10 mg sent to the pharmacy.

## 2016-02-01 MED ORDER — MEMANTINE HCL 5 MG PO TABS: 5.0000 mg | ORAL_TABLET | Freq: Two times a day (BID) | ORAL | Status: DC

## 2016-02-01 NOTE — Addendum Note (Signed)
Addended by: Gregor Hams on: 02/01/2016 07:43 AM   Modules accepted: Orders

## 2016-02-01 NOTE — Telephone Encounter (Signed)
I sent in generic twice daily Namenda.

## 2016-02-01 NOTE — Telephone Encounter (Signed)
Patients son advised

## 2016-03-05 ENCOUNTER — Encounter: Payer: Self-pay | Admitting: Sports Medicine

## 2016-03-05 ENCOUNTER — Ambulatory Visit (INDEPENDENT_AMBULATORY_CARE_PROVIDER_SITE_OTHER): Payer: Medicare Other | Admitting: Sports Medicine

## 2016-03-05 VITALS — BP 98/46 | HR 60 | Resp 16 | Wt 133.0 lb

## 2016-03-05 DIAGNOSIS — M19011 Primary osteoarthritis, right shoulder: Secondary | ICD-10-CM | POA: Diagnosis not present

## 2016-03-05 DIAGNOSIS — M7022 Olecranon bursitis, left elbow: Secondary | ICD-10-CM

## 2016-03-05 DIAGNOSIS — G309 Alzheimer's disease, unspecified: Secondary | ICD-10-CM

## 2016-03-05 DIAGNOSIS — G3184 Mild cognitive impairment, so stated: Principal | ICD-10-CM

## 2016-03-05 DIAGNOSIS — F0670 Mild neurocognitive disorder due to known physiological condition without behavioral disturbance: Secondary | ICD-10-CM

## 2016-03-05 NOTE — Assessment & Plan Note (Signed)
And completely resolved and doing well after injection at the last visit

## 2016-03-05 NOTE — Assessment & Plan Note (Signed)
Currently doing well on Aricept, rivastigmine, good improvement in memory with adding memantine at the last visit.

## 2016-03-05 NOTE — Assessment & Plan Note (Signed)
Aspiration and injection, strapped with compressive dressing, return in one month.

## 2016-03-05 NOTE — Progress Notes (Signed)
  Subjective:    CC: Follow-up  HPI: Right shoulder pain: Resolved after injection  Mild cognitive impairment: Doing better after the addition of memantine.  Left elbow swelling: After mild trauma. Minimally painful.  Past medical history, Surgical history, Family history not pertinant except as noted below, Social history, Allergies, and medications have been entered into the medical record, reviewed, and no changes needed.   Review of Systems: No fevers, chills, night sweats, weight loss, chest pain, or shortness of breath.   Objective:    General: Well Developed, well nourished, and in no acute distress.  Neuro: Alert and oriented x3, extra-ocular muscles intact, sensation grossly intact.  HEENT: Normocephalic, atraumatic, pupils equal round reactive to light, neck supple, no masses, no lymphadenopathy, thyroid nonpalpable.  Skin: Warm and dry, no rashes. Cardiac: Regular rate and rhythm, no murmurs rubs or gallops, no lower extremity edema.  Respiratory: Clear to auscultation bilaterally. Not using accessory muscles, speaking in full sentences. Left elbow: Visible fluctuant olecranon bursitis.  Procedure: Real-time Ultrasound Guided aspiration/Injection of left olecranon bursa Device: GE Logiq E  Verbal informed consent obtained.  Time-out conducted.  Noted no overlying erythema, induration, or other signs of local infection.  Skin prepped in a sterile fashion.  Local anesthesia: Topical Ethyl chloride.  With sterile technique and under real time ultrasound guidance:  Aspirated 10 mL serosanguineous fluid, syringe switched and 1/2 mL lidocaine, 1/2 mL kenalog 40 injected easily. Completed without difficulty  Pain immediately resolved suggesting accurate placement of the medication.  Advised to call if fevers/chills, erythema, induration, drainage, or persistent bleeding.  Images permanently stored and available for review in the ultrasound unit.  Impression: Technically  successful ultrasound guided injection.  Elbow was then strapped with compressive dressing.  Impression and Recommendations:

## 2016-03-27 ENCOUNTER — Other Ambulatory Visit: Payer: Self-pay | Admitting: Family Medicine

## 2016-04-02 ENCOUNTER — Ambulatory Visit (INDEPENDENT_AMBULATORY_CARE_PROVIDER_SITE_OTHER): Payer: Medicare Other | Admitting: Sports Medicine

## 2016-04-02 ENCOUNTER — Encounter: Payer: Self-pay | Admitting: Sports Medicine

## 2016-04-02 VITALS — BP 162/55 | HR 56 | Resp 16 | Wt 133.1 lb

## 2016-04-02 DIAGNOSIS — G309 Alzheimer's disease, unspecified: Secondary | ICD-10-CM | POA: Diagnosis not present

## 2016-04-02 DIAGNOSIS — F0670 Mild neurocognitive disorder due to known physiological condition without behavioral disturbance: Secondary | ICD-10-CM

## 2016-04-02 DIAGNOSIS — M7022 Olecranon bursitis, left elbow: Secondary | ICD-10-CM | POA: Diagnosis not present

## 2016-04-02 DIAGNOSIS — G3184 Mild cognitive impairment, so stated: Secondary | ICD-10-CM

## 2016-04-02 LAB — POCT URINALYSIS DIPSTICK
Bilirubin, UA: NEGATIVE
Blood, UA: NEGATIVE
Glucose, UA: NEGATIVE
Ketones, UA: NEGATIVE
Leukocytes, UA: NEGATIVE
Nitrite, UA: NEGATIVE
Protein, UA: NEGATIVE
Spec Grav, UA: 1.02
Urobilinogen, UA: 0.2
pH, UA: 7

## 2016-04-02 MED ORDER — MEMANTINE HCL 10 MG PO TABS: 10.0000 mg | ORAL_TABLET | Freq: Two times a day (BID) | ORAL | Status: AC

## 2016-04-02 NOTE — Assessment & Plan Note (Addendum)
Currently doing okay on Aricept, right estate mean, we added Namenda at the last visit, increasing Namenda to 10 mg twice a day. Having a bit of increased forgetfulness, adding a urinalysis.

## 2016-04-02 NOTE — Progress Notes (Signed)
  Subjective:    CC: Follow-up  HPI:  Left olecranon bursitis: Resolved after aspiration and injection  Alzheimer's disease: Doing well on rivastigmine, Aricept, Namenda, a little bit of increased forgetfulness. Agreeable to go up on the dose.  Past medical history, Surgical history, Family history not pertinant except as noted below, Social history, Allergies, and medications have been entered into the medical record, reviewed, and no changes needed.   Review of Systems: No fevers, chills, night sweats, weight loss, chest pain, or shortness of breath.   Objective:    General: Well Developed, well nourished, and in no acute distress.  Neuro: Alert and oriented x3, extra-ocular muscles intact, sensation grossly intact.  HEENT: Normocephalic, atraumatic, pupils equal round reactive to light, neck supple, no masses, no lymphadenopathy, thyroid nonpalpable.  Skin: Warm and dry, no rashes. Cardiac: Regular rate and rhythm, no murmurs rubs or gallops, no lower extremity edema.  Respiratory: Clear to auscultation bilaterally. Not using accessory muscles, speaking in full sentences.  Impression and Recommendations:

## 2016-04-02 NOTE — Assessment & Plan Note (Signed)
Resolved after aspiration and injection

## 2016-04-02 NOTE — Addendum Note (Signed)
Addended by: Elizabeth Sauer on: 04/02/2016 02:13 PM   Modules accepted: Orders

## 2016-04-30 ENCOUNTER — Ambulatory Visit (INDEPENDENT_AMBULATORY_CARE_PROVIDER_SITE_OTHER): Payer: Medicare Other | Admitting: Sports Medicine

## 2016-04-30 ENCOUNTER — Ambulatory Visit (INDEPENDENT_AMBULATORY_CARE_PROVIDER_SITE_OTHER): Payer: Medicare Other

## 2016-04-30 VITALS — BP 106/62 | HR 66 | Resp 16 | Wt 130.7 lb

## 2016-04-30 DIAGNOSIS — M79644 Pain in right finger(s): Secondary | ICD-10-CM

## 2016-04-30 DIAGNOSIS — M7989 Other specified soft tissue disorders: Secondary | ICD-10-CM

## 2016-04-30 NOTE — Progress Notes (Signed)
Patient ID: Benjamin Fowler, male   DOB: 05-10-1923, 82 y.o.   MRN: [id number]  Subjective:    CC: R second finger pain   HPI: 82 yo presenting with swelling and pain around R MCP joint of second finger.  He says he noticed the swelling and pain three days ago and it has been constant since then.  Ice and heat has not relieved the pain/swelling.  Pain does not radiate.  It is moderate, persistent, no trauma.  Past medical history, Surgical history, Family history not pertinant except as noted below, Social history, Allergies, and medications have been entered into the medical record, reviewed, and no changes needed.   Review of Systems: No fevers, chills, night sweats, weight loss, chest pain, or shortness of breath.   Objective:    General: Well Developed, well nourished, and in no acute distress.  Neuro: Alert and oriented x3, extra-ocular muscles intact, sensation grossly intact.  HEENT: Normocephalic, atraumatic, pupils equal round reactive to light, neck supple, no masses, no lymphadenopathy, thyroid nonpalpable.  Skin: Warm and dry, no rashes. Cardiac: Regular rate and rhythm, no murmurs rubs or gallops, no lower extremity edema.  Respiratory: Clear to auscultation bilaterally. Not using accessory muscles, speaking in full sentences. Right hand:  Swelling around R MCP joint of second digit.  No ecchymoses or erythema.  Normal ROM and strength.   Mild TTP at R MCP joint of second digit.    Procedure: Real-time Ultrasound Guided Injection of right second MCP Device: GE Logiq E  Verbal informed consent obtained.  Time-out conducted.  Noted no overlying erythema, induration, or other signs of local infection.  Skin prepped in a sterile fashion.  Local anesthesia: Topical Ethyl chloride.  With sterile technique and under real time ultrasound guidance:  30-gauge needle advanced into the MCP and 1/2 mL kenalog 40, 1/2 mL lidocaine injected easily. Completed without difficulty  Pain  immediately resolved suggesting accurate placement of the medication.  Advised to call if fevers/chills, erythema, induration, drainage, or persistent bleeding.  Images permanently stored and available for review in the ultrasound unit.  Impression: Technically successful ultrasound guided injection.  Impression and Recommendations:   XR consistent with OA.  Will provide steroid injection this visit, follow-up if pain continues

## 2016-04-30 NOTE — Assessment & Plan Note (Signed)
Right second MCP injection. X-rays were negative with the exception of arthritis.  Return to see me as needed in one month.

## 2016-05-07 DIAGNOSIS — Z7982 Long term (current) use of aspirin: Secondary | ICD-10-CM | POA: Diagnosis not present

## 2016-05-07 DIAGNOSIS — D649 Anemia, unspecified: Secondary | ICD-10-CM | POA: Diagnosis not present

## 2016-05-07 DIAGNOSIS — D696 Thrombocytopenia, unspecified: Secondary | ICD-10-CM | POA: Diagnosis not present

## 2016-05-07 DIAGNOSIS — D7589 Other specified diseases of blood and blood-forming organs: Secondary | ICD-10-CM | POA: Diagnosis not present

## 2016-06-04 ENCOUNTER — Ambulatory Visit (INDEPENDENT_AMBULATORY_CARE_PROVIDER_SITE_OTHER): Payer: Medicare Other | Admitting: Sports Medicine

## 2016-06-04 ENCOUNTER — Encounter: Payer: Self-pay | Admitting: Sports Medicine

## 2016-06-04 DIAGNOSIS — F0670 Mild neurocognitive disorder due to known physiological condition without behavioral disturbance: Secondary | ICD-10-CM

## 2016-06-04 DIAGNOSIS — G309 Alzheimer's disease, unspecified: Secondary | ICD-10-CM | POA: Diagnosis not present

## 2016-06-04 DIAGNOSIS — M7989 Other specified soft tissue disorders: Secondary | ICD-10-CM

## 2016-06-04 DIAGNOSIS — G3184 Mild cognitive impairment, so stated: Secondary | ICD-10-CM

## 2016-06-04 NOTE — Assessment & Plan Note (Signed)
Resolved after injection at the right second MCP

## 2016-06-04 NOTE — Assessment & Plan Note (Signed)
Doing well with Exelon, donepezil, memantine

## 2016-06-04 NOTE — Progress Notes (Signed)
  Subjective:    CC: follow-up R 2nd MCP injection  HPI: 82 yo M coming in for follow-up from R 2nd MCP injection one month ago.  Injection completely resolved pain - he has full range of motion and no complaints at this time.  He has been doing some research on "miracle drugs" for dementia and was wondering if they are a worthwhile investment.   Past medical history, Surgical history, Family history not pertinant except as noted below, Social history, Allergies, and medications have been entered into the medical record, reviewed, and no changes needed.   Review of Systems: No fevers, chills, night sweats, weight loss, chest pain, or shortness of breath.   Objective:    General: Well Developed, well nourished, and in no acute distress.  Neuro: Alert and oriented x3, extra-ocular muscles intact, sensation grossly intact.  HEENT: Normocephalic, atraumatic, pupils equal round reactive to light, neck supple, no masses, no lymphadenopathy, thyroid nonpalpable.  Skin: Warm and dry, no rashes. Cardiac: Regular rate and rhythm, no murmurs rubs or gallops, no lower extremity edema.  Respiratory: Clear to auscultation bilaterally. Not using accessory muscles, speaking in full sentences.   Impression and Recommendations:   1. R hand swelling - completely resolved after steroid injection to R second MCP  -Follow-up PRN

## 2016-08-06 ENCOUNTER — Encounter: Payer: Self-pay | Admitting: Sports Medicine

## 2016-08-06 ENCOUNTER — Ambulatory Visit (INDEPENDENT_AMBULATORY_CARE_PROVIDER_SITE_OTHER): Payer: Medicare Other | Admitting: Sports Medicine

## 2016-08-06 VITALS — BP 109/61 | HR 59 | Ht 63.0 in | Wt 132.0 lb

## 2016-08-06 DIAGNOSIS — R531 Weakness: Secondary | ICD-10-CM

## 2016-08-06 DIAGNOSIS — Z23 Encounter for immunization: Secondary | ICD-10-CM | POA: Diagnosis not present

## 2016-08-06 DIAGNOSIS — D469 Myelodysplastic syndrome, unspecified: Secondary | ICD-10-CM

## 2016-08-06 LAB — POCT HEMOGLOBIN: Hemoglobin: 8.7 g/dL — AB (ref 14.1–18.1)

## 2016-08-06 NOTE — Assessment & Plan Note (Signed)
Persistent anemia, this time down to 8.7 hemoglobin, initial plan by hematology was to keep hemoglobin above 10 with Aranesp injections if needed. I think at this point he is a candidate for Aranesp. I have left a message for his hematologist, hopefully they can assist with setting him up for Aranesp injections.

## 2016-08-06 NOTE — Progress Notes (Signed)
  Subjective:    CC: Follow-up  HPI: Myelodysplastic syndrome: Persistence of anemia, in fact worsening from his previous visit with hematology in July. There initial plan was Aranesp injections. He is somewhat pale, with easy fatigability. They are agreeable to proceed.  Past medical history:  Negative.  See flowsheet/record as well for more information.  Surgical history: Negative.  See flowsheet/record as well for more information.  Family history: Negative.  See flowsheet/record as well for more information.  Social history: Negative.  See flowsheet/record as well for more information.  Allergies, and medications have been entered into the medical record, reviewed, and no changes needed.   Review of Systems: No fevers, chills, night sweats, weight loss, chest pain, or shortness of breath.   Objective:    General: Well Developed, well nourished, and in no acute distress.  Neuro: Alert and oriented x3, extra-ocular muscles intact, sensation grossly intact.  HEENT: Normocephalic, atraumatic, pupils equal round reactive to light, neck supple, no masses, no lymphadenopathy, thyroid nonpalpable.  Skin: Warm and dry, no rashes. Cardiac: Regular rate and rhythm, no murmurs rubs or gallops, no lower extremity edema.  Respiratory: Clear to auscultation bilaterally. Not using accessory muscles, speaking in full sentences.  Impression and Recommendations:    Myelodysplastic syndrome Persistent anemia, this time down to 8.7 hemoglobin, initial plan by hematology was to keep hemoglobin above 10 with Aranesp injections if needed. I think at this point he is a candidate for Aranesp. I have left a message for his hematologist, hopefully they can assist with setting him up for Aranesp injections.

## 2016-08-16 ENCOUNTER — Other Ambulatory Visit: Payer: Self-pay | Admitting: Sports Medicine

## 2016-08-16 DIAGNOSIS — F0670 Mild neurocognitive disorder due to known physiological condition without behavioral disturbance: Secondary | ICD-10-CM

## 2016-08-16 DIAGNOSIS — G3184 Mild cognitive impairment, so stated: Principal | ICD-10-CM

## 2016-08-16 DIAGNOSIS — G309 Alzheimer's disease, unspecified: Secondary | ICD-10-CM

## 2016-08-20 ENCOUNTER — Telehealth: Payer: Self-pay | Admitting: Sports Medicine

## 2016-08-20 ENCOUNTER — Ambulatory Visit (INDEPENDENT_AMBULATORY_CARE_PROVIDER_SITE_OTHER): Payer: Medicare Other

## 2016-08-20 DIAGNOSIS — I4891 Unspecified atrial fibrillation: Secondary | ICD-10-CM | POA: Diagnosis not present

## 2016-08-20 DIAGNOSIS — I482 Chronic atrial fibrillation: Secondary | ICD-10-CM | POA: Diagnosis not present

## 2016-08-20 DIAGNOSIS — F05 Delirium due to known physiological condition: Secondary | ICD-10-CM

## 2016-08-20 DIAGNOSIS — B9620 Unspecified Escherichia coli [E. coli] as the cause of diseases classified elsewhere: Secondary | ICD-10-CM | POA: Diagnosis not present

## 2016-08-20 DIAGNOSIS — N390 Urinary tract infection, site not specified: Secondary | ICD-10-CM | POA: Diagnosis not present

## 2016-08-20 DIAGNOSIS — D638 Anemia in other chronic diseases classified elsewhere: Secondary | ICD-10-CM | POA: Diagnosis not present

## 2016-08-20 DIAGNOSIS — Z7982 Long term (current) use of aspirin: Secondary | ICD-10-CM | POA: Diagnosis not present

## 2016-08-20 DIAGNOSIS — S0990XA Unspecified injury of head, initial encounter: Secondary | ICD-10-CM | POA: Diagnosis not present

## 2016-08-20 DIAGNOSIS — N3000 Acute cystitis without hematuria: Secondary | ICD-10-CM

## 2016-08-20 DIAGNOSIS — D539 Nutritional anemia, unspecified: Secondary | ICD-10-CM | POA: Diagnosis not present

## 2016-08-20 DIAGNOSIS — G319 Degenerative disease of nervous system, unspecified: Secondary | ICD-10-CM

## 2016-08-20 DIAGNOSIS — D696 Thrombocytopenia, unspecified: Secondary | ICD-10-CM | POA: Diagnosis not present

## 2016-08-20 NOTE — Telephone Encounter (Signed)
Recent fall, increasing memory loss. Stat CT of the head, urinalysis, CBC, CMP. TSH. Return to see me later this week.

## 2016-08-20 NOTE — Telephone Encounter (Signed)
Benjamin Fowler needs order to have the stat CT scan is having amnesia

## 2016-08-21 ENCOUNTER — Ambulatory Visit (INDEPENDENT_AMBULATORY_CARE_PROVIDER_SITE_OTHER): Payer: Medicare Other | Admitting: Sports Medicine

## 2016-08-21 ENCOUNTER — Encounter: Payer: Self-pay | Admitting: Sports Medicine

## 2016-08-21 DIAGNOSIS — D469 Myelodysplastic syndrome, unspecified: Secondary | ICD-10-CM

## 2016-08-21 LAB — URINALYSIS
Bilirubin Urine: NEGATIVE
Glucose, UA: NEGATIVE
Hgb urine dipstick: NEGATIVE
Ketones, ur: NEGATIVE
Leukocytes, UA: NEGATIVE
Nitrite: NEGATIVE
Protein, ur: NEGATIVE
Specific Gravity, Urine: 1.021 (ref 1.001–1.035)
pH: 5 (ref 5.0–8.0)

## 2016-08-21 LAB — COMPREHENSIVE METABOLIC PANEL
ALT: 12 U/L (ref 9–46)
Alkaline Phosphatase: 43 U/L (ref 40–115)
BUN: 24 mg/dL (ref 7–25)
Chloride: 108 mmol/L (ref 98–110)
Creat: 1.32 mg/dL — ABNORMAL HIGH (ref 0.70–1.11)
Sodium: 140 mmol/L (ref 135–146)
Total Protein: 5.9 g/dL — ABNORMAL LOW (ref 6.1–8.1)

## 2016-08-21 LAB — CBC WITH DIFFERENTIAL/PLATELET
Basophils Absolute: 41 {cells}/uL (ref 0–200)
Basophils Relative: 1 %
Eosinophils Absolute: 123 {cells}/uL (ref 15–500)
Eosinophils Relative: 3 %
HCT: 25.5 % — ABNORMAL LOW (ref 38.5–50.0)
Hemoglobin: 7.9 g/dL — ABNORMAL LOW (ref 13.2–17.1)
Lymphocytes Relative: 16 %
Lymphs Abs: 656 {cells}/uL — ABNORMAL LOW (ref 850–3900)
MCH: 32.8 pg (ref 27.0–33.0)
MCHC: 31 g/dL — ABNORMAL LOW (ref 32.0–36.0)
MCV: 105.8 fL — ABNORMAL HIGH (ref 80.0–100.0)
MPV: 10.2 fL (ref 7.5–12.5)
Monocytes Absolute: 287 {cells}/uL (ref 200–950)
Monocytes Relative: 7 %
Neutro Abs: 2993 cells/uL (ref 1500–7800)
Neutrophils Relative %: 73 %
Platelets: 96 10*3/uL — ABNORMAL LOW (ref 140–400)
RBC: 2.41 MIL/uL — ABNORMAL LOW (ref 4.20–5.80)
RDW: 14.8 % (ref 11.0–15.0)
WBC: 4.1 K/uL (ref 3.8–10.8)

## 2016-08-21 LAB — COMPREHENSIVE METABOLIC PANEL WITH GFR
AST: 20 U/L (ref 10–35)
Albumin: 3.6 g/dL (ref 3.6–5.1)
CO2: 23 mmol/L (ref 20–31)
Calcium: 9.6 mg/dL (ref 8.6–10.3)
Glucose, Bld: 119 mg/dL — ABNORMAL HIGH (ref 65–99)
Potassium: 4.1 mmol/L (ref 3.5–5.3)
Total Bilirubin: 0.3 mg/dL (ref 0.2–1.2)

## 2016-08-21 LAB — TSH: TSH: 2.88 mIU/L (ref 0.40–4.50)

## 2016-08-21 NOTE — Assessment & Plan Note (Signed)
With a fall recently. CT of the head, as well as blood work was unremarkable with the exception of megaloblastic type anemia, with a hemoglobin worsened to 7.9. He is seeing a hematologist, and does have a diagnosis of myelodysplastic syndrome. Yesterday he received an injection of Aranesp however this will take some time to start working. I do suspect with his worsening hemoglobin, increasing forgetfulness and falls he needs a blood transfusion.

## 2016-08-21 NOTE — Progress Notes (Signed)
  Subjective:    CC: Follow-up  HPI: This is a pleasant 82 year old male with myelodysplastic syndrome and moderate to severe anemia, he's been having increasing forgetfulness, falls, had a fall yesterday and hit his head, we did a CT scan and routine blood work, all of which was unremarkable with the exception of the blood work which showed worsening anemia. He did see his hematologist yesterday and proceed shot of Aranesp, but understands this will take several weeks to start working.  Past medical history:  Negative.  See flowsheet/record as well for more information.  Surgical history: Negative.  See flowsheet/record as well for more information.  Family history: Negative.  See flowsheet/record as well for more information.  Social history: Negative.  See flowsheet/record as well for more information.  Allergies, and medications have been entered into the medical record, reviewed, and no changes needed.   Review of Systems: No fevers, chills, night sweats, weight loss, chest pain, or shortness of breath.   Objective:    General: Well Developed, well nourished, and in no acute distress.  Neuro: Alert and oriented x3, extra-ocular muscles intact, sensation grossly intact.  HEENT: Normocephalic, atraumatic, pupils equal round reactive to light, neck supple, no masses, no lymphadenopathy, thyroid nonpalpable.  Skin: Warm and dry, no rashes. Cardiac: Regular rate and rhythm, no murmurs rubs or gallops, no lower extremity edema.  Respiratory: Clear to auscultation bilaterally. Not using accessory muscles, speaking in full sentences.  Impression and Recommendations:    Myelodysplastic syndrome With a fall recently. CT of the head, as well as blood work was unremarkable with the exception of megaloblastic type anemia, with a hemoglobin worsened to 7.9. He is seeing a hematologist, and does have a diagnosis of myelodysplastic syndrome. Yesterday he received an injection of Aranesp however  this will take some time to start working. I do suspect with his worsening hemoglobin, increasing forgetfulness and falls he needs a blood transfusion.  I spent 40 minutes with this patient, greater than 50% was face-to-face time counseling regarding the above diagnoses

## 2016-08-23 DIAGNOSIS — N3000 Acute cystitis without hematuria: Secondary | ICD-10-CM | POA: Insufficient documentation

## 2016-08-23 LAB — URINE CULTURE

## 2016-08-23 MED ORDER — NITROFURANTOIN MONOHYD MACRO 100 MG PO CAPS: 100.0000 mg | ORAL_CAPSULE | Freq: Two times a day (BID) | ORAL | 0 refills | Status: AC

## 2016-08-23 NOTE — Addendum Note (Signed)
Addended by: Silverio Decamp on: 08/23/2016 11:21 AM   Modules accepted: Orders

## 2016-08-23 NOTE — Assessment & Plan Note (Signed)
Pansensitive Escherichia coli, this is likely also contributing to his falls and delirium. Adding Macrobid.

## 2016-08-27 ENCOUNTER — Telehealth: Payer: Self-pay

## 2016-08-27 DIAGNOSIS — D638 Anemia in other chronic diseases classified elsewhere: Secondary | ICD-10-CM | POA: Diagnosis not present

## 2016-08-27 MED ORDER — AMOXICILLIN 500 MG PO TABS: ORAL_TABLET | ORAL | 0 refills | Status: DC

## 2016-08-27 NOTE — Telephone Encounter (Signed)
Dental office called requesting for Rx of amoxicillin sent to the pharmacy. Pt is having a cleaning done. Will route to PCP for further review.

## 2016-08-28 DIAGNOSIS — D649 Anemia, unspecified: Secondary | ICD-10-CM | POA: Diagnosis not present

## 2016-09-03 DIAGNOSIS — I4891 Unspecified atrial fibrillation: Secondary | ICD-10-CM | POA: Diagnosis not present

## 2016-09-03 DIAGNOSIS — D696 Thrombocytopenia, unspecified: Secondary | ICD-10-CM | POA: Diagnosis not present

## 2016-09-03 DIAGNOSIS — D649 Anemia, unspecified: Secondary | ICD-10-CM | POA: Diagnosis not present

## 2016-09-03 DIAGNOSIS — D638 Anemia in other chronic diseases classified elsewhere: Secondary | ICD-10-CM | POA: Diagnosis not present

## 2016-09-04 ENCOUNTER — Ambulatory Visit (INDEPENDENT_AMBULATORY_CARE_PROVIDER_SITE_OTHER): Payer: Medicare Other

## 2016-09-04 ENCOUNTER — Ambulatory Visit (INDEPENDENT_AMBULATORY_CARE_PROVIDER_SITE_OTHER): Payer: Medicare Other | Admitting: Family Medicine

## 2016-09-04 ENCOUNTER — Encounter: Payer: Self-pay | Admitting: Family Medicine

## 2016-09-04 VITALS — BP 144/66 | HR 67 | Wt 142.0 lb

## 2016-09-04 DIAGNOSIS — W19XXXA Unspecified fall, initial encounter: Secondary | ICD-10-CM

## 2016-09-04 DIAGNOSIS — M25559 Pain in unspecified hip: Secondary | ICD-10-CM | POA: Diagnosis not present

## 2016-09-04 DIAGNOSIS — S3993XA Unspecified injury of pelvis, initial encounter: Secondary | ICD-10-CM | POA: Diagnosis not present

## 2016-09-04 DIAGNOSIS — M791 Myalgia: Secondary | ICD-10-CM | POA: Diagnosis not present

## 2016-09-04 DIAGNOSIS — I7090 Unspecified atherosclerosis: Secondary | ICD-10-CM

## 2016-09-04 DIAGNOSIS — Z96641 Presence of right artificial hip joint: Secondary | ICD-10-CM

## 2016-09-04 DIAGNOSIS — M810 Age-related osteoporosis without current pathological fracture: Secondary | ICD-10-CM

## 2016-09-04 DIAGNOSIS — M7918 Myalgia, other site: Secondary | ICD-10-CM

## 2016-09-04 NOTE — Patient Instructions (Signed)
Thank you for coming in today. Get an xray today.  You should hear from home health PT soon. Please follow up with Dr T in about 3 weeks.    Contusion Introduction A contusion is a deep bruise. Contusions happen when an injury causes bleeding under the skin. Symptoms of bruising include pain, swelling, and discolored skin. The skin may turn blue, purple, or yellow. Follow these instructions at home:  Rest the injured area.  If told, put ice on the injured area.  Put ice in a plastic bag.  Place a towel between your skin and the bag.  Leave the ice on for 20 minutes, 2-3 times per day.  If told, put light pressure (compression) on the injured area using an elastic bandage. Make sure the bandage is not too tight. Remove it and put it back on as told by your doctor.  If possible, raise (elevate) the injured area above the level of your heart while you are sitting or lying down.  Take over-the-counter and prescription medicines only as told by your doctor. Contact a doctor if:  Your symptoms do not get better after several days of treatment.  Your symptoms get worse.  You have trouble moving the injured area. Get help right away if:  You have very bad pain.  You have a loss of feeling (numbness) in a hand or foot.  Your hand or foot turns pale or cold. This information is not intended to replace advice given to you by your health care provider. Make sure you discuss any questions you have with your health care provider. Document Released: 03/25/2008 Document Revised: 03/14/2016 Document Reviewed: 02/22/2015  2017 [employer]vier

## 2016-09-04 NOTE — Progress Notes (Signed)
Benjamin Fowler is a 82 y.o. male who presents to [facility] today for  falland buttockspain.Patient fell a ew weeks agolanding on his left side. He was seen by his PCP. At that time his main complaint was neck pain. He had a cervical spine CT scan which was unremarkable. Last several weeks is noted worsening left buttocks pain. Pain is worse with activity and better with rest. He denies any radiating pain weakness or numbness. He has tried naproxen for pain which has helped.   Past Medical History:  Diagnosis Date  . Atrial fibrillation ([place]) 07/16/2012  . Hypertension 07/16/2012  . Mild neurocognitive disorder due to Alzheimer's disease 07/29/2012  . Myelodysplastic syndrome ([place]) 11/21/2014   No past surgical history on file. Social History  Substance Use Topics  . Smoking status: Former Smoker    Types: Pipe  . Smokeless tobacco: Not on file  . Alcohol use No     ROS:  As above   Medications: Current Outpatient Prescriptions  Medication Sig Dispense Refill  . amoxicillin (AMOXIL) 500 MG tablet 4 tabs (2000mg ) 1 hours prior to procedure 4 tablet 0  . aspirin 162 MG EC tablet Take 1 tablet (162 mg total) by mouth daily. 90 tablet 3  . donepezil (ARICEPT) 10 MG tablet Take 1 tablet (10 mg total) by mouth at bedtime. 90 tablet 3  . enalapril (VASOTEC) 10 MG tablet Take 1 tablet (10 mg total) by mouth 2 (two) times daily. 180 tablet 3  . memantine (NAMENDA) 10 MG tablet Take 1 tablet (10 mg total) by mouth 2 (two) times daily. 60 tablet 11  . metoprolol tartrate (LOPRESSOR) 25 MG tablet Take 1 tablet (25 mg total) by mouth 2 (two) times daily. 180 tablet 1  . nitrofurantoin, macrocrystal-monohydrate, (MACROBID) 100 MG capsule Take 1 capsule (100 mg total) by mouth 2 (two) times daily. 14 capsule 0  . rivastigmine (EXELON) 1.5 MG capsule TAKE ONE CAPSULE BY MOUTH TWICE DAILY 90 capsule 3   No current facility-administered medications for  this visit.    Allergies  Allergen Reactions  . Other Nausea Only    Frog legs     Exam:  BP (!) 144/66   Pulse 67   Wt 142 lb (64.4 kg)   BMI 25.15 kg/m  General: Well Developed, well nourished, and in no acute distress. Elderly man frail-appearing Neuro/Psych: Alert and oriented x3, extra-ocular muscles intact, able to move all 4 extremities, sensation grossly intact. Skin: Warm and dry, no rashes noted.  Respiratory: Not using accessory muscles, speaking in full sentences, trachea midline.  Cardiovascular: Pulses palpable, no extremity edema. Abdomen: Does not appear distended. MSK: Mildly tender to palpation left buttocks. Nontender over the coccyx or ischial tuberosity. Patient is able to ambulate using a cane.     No results found for this or any previous visit (from the past 48 hour(s)). No results found.    Assessment and Plan: 82 y.o. male with  Notes pain following a fall. Plan for x-ray and referral for home health physical therapy. Recommend Tylenol for pain control. Follow-up with PCP in the near future.    Orders Placed This Encounter  Procedures  . DG Pelvis 1-2 Views    Standing Status:   Future    Number of Occurrences:   1    Standing Expiration Date:   11/04/2017    Order Specific Question:   Reason for Exam (SYMPTOM  OR DIAGNOSIS REQUIRED)  Answer:   eval pain left buttock s/p fall 3 weeks ago    Order Specific Question:   Preferred imaging location?    Answer:   [facility]  . Ambulatory referral to Home Health    Referral Priority:   Routine    Referral Type:   Home Health Care    Referral Reason:   Specialty Services Required    Requested Specialty:   [place]    Number of Visits Requested:   1    Discussed warning signs or symptoms. Please see discharge instructions. Patient expresses understanding.

## 2016-09-05 ENCOUNTER — Encounter: Payer: Self-pay | Admitting: Family Medicine

## 2016-09-05 DIAGNOSIS — I700 Atherosclerosis of aorta: Secondary | ICD-10-CM | POA: Insufficient documentation

## 2016-09-08 DIAGNOSIS — I10 Essential (primary) hypertension: Secondary | ICD-10-CM | POA: Diagnosis not present

## 2016-09-08 DIAGNOSIS — D469 Myelodysplastic syndrome, unspecified: Secondary | ICD-10-CM | POA: Diagnosis not present

## 2016-09-08 DIAGNOSIS — G309 Alzheimer's disease, unspecified: Secondary | ICD-10-CM | POA: Diagnosis not present

## 2016-09-08 DIAGNOSIS — I4891 Unspecified atrial fibrillation: Secondary | ICD-10-CM | POA: Diagnosis not present

## 2016-09-08 DIAGNOSIS — Z7982 Long term (current) use of aspirin: Secondary | ICD-10-CM | POA: Diagnosis not present

## 2016-09-08 DIAGNOSIS — M25552 Pain in left hip: Secondary | ICD-10-CM | POA: Diagnosis not present

## 2016-09-08 DIAGNOSIS — F0280 Dementia in other diseases classified elsewhere without behavioral disturbance: Secondary | ICD-10-CM | POA: Diagnosis not present

## 2016-09-08 DIAGNOSIS — M7022 Olecranon bursitis, left elbow: Secondary | ICD-10-CM | POA: Diagnosis not present

## 2016-09-08 DIAGNOSIS — M19011 Primary osteoarthritis, right shoulder: Secondary | ICD-10-CM | POA: Diagnosis not present

## 2016-09-08 DIAGNOSIS — R7303 Prediabetes: Secondary | ICD-10-CM | POA: Diagnosis not present

## 2016-09-10 DIAGNOSIS — D649 Anemia, unspecified: Secondary | ICD-10-CM | POA: Diagnosis not present

## 2016-09-10 DIAGNOSIS — D696 Thrombocytopenia, unspecified: Secondary | ICD-10-CM | POA: Diagnosis not present

## 2016-09-10 DIAGNOSIS — Z7982 Long term (current) use of aspirin: Secondary | ICD-10-CM | POA: Diagnosis not present

## 2016-09-10 DIAGNOSIS — I4891 Unspecified atrial fibrillation: Secondary | ICD-10-CM | POA: Diagnosis not present

## 2016-09-11 ENCOUNTER — Other Ambulatory Visit: Payer: Self-pay

## 2016-09-11 MED ORDER — AMOXICILLIN 500 MG PO TABS: ORAL_TABLET | ORAL | 0 refills | Status: AC

## 2016-09-18 ENCOUNTER — Ambulatory Visit: Payer: Medicare Other | Admitting: Sports Medicine

## 2016-09-20 ENCOUNTER — Telehealth: Payer: Self-pay | Admitting: Sports Medicine

## 2016-09-20 ENCOUNTER — Ambulatory Visit: Payer: Medicare Other | Admitting: Sports Medicine

## 2016-09-20 MED ORDER — POLYETHYLENE GLYCOL 3350 17 G PO PACK: 17.0000 g | PACK | Freq: Two times a day (BID) | ORAL | 11 refills | Status: DC

## 2016-09-20 MED ORDER — PREDNISONE 50 MG PO TABS: ORAL_TABLET | ORAL | 0 refills | Status: DC

## 2016-09-20 NOTE — Telephone Encounter (Signed)
Adding prednisone 50 mg daily as well as MiraLAX.

## 2016-09-20 NOTE — Telephone Encounter (Signed)
Note for Benjamin Fowler, reminder that you will prescribe steroids for him for the weekend for his back pain, and Benjamin Fowler requested if he could be given something or recommended a drug or medicine for his constipation b/c he is not able to stand with his back pain.

## 2016-09-21 ENCOUNTER — Other Ambulatory Visit: Payer: Self-pay | Admitting: Physician Assistant

## 2016-09-21 MED ORDER — PREDNISONE 50 MG PO TABS: ORAL_TABLET | ORAL | 0 refills | Status: DC

## 2016-09-21 MED ORDER — POLYETHYLENE GLYCOL 3350 17 G PO PACK: 17.0000 g | PACK | Freq: Two times a day (BID) | ORAL | 11 refills | Status: AC

## 2016-09-21 NOTE — Progress Notes (Signed)
On call nurse called and patient did not have prescription for steroid at local pharmacy. Dr. Darene Lamer sent to [facility] but apparently they don't frequent that pharmacy. I re-sent to [facility] on [address]. Iran Planas PA-C

## 2016-09-24 ENCOUNTER — Ambulatory Visit (INDEPENDENT_AMBULATORY_CARE_PROVIDER_SITE_OTHER): Payer: Medicare Other | Admitting: Sports Medicine

## 2016-09-24 ENCOUNTER — Encounter: Payer: Self-pay | Admitting: Sports Medicine

## 2016-09-24 DIAGNOSIS — M51369 Other intervertebral disc degeneration, lumbar region without mention of lumbar back pain or lower extremity pain: Secondary | ICD-10-CM | POA: Insufficient documentation

## 2016-09-24 DIAGNOSIS — M5136 Other intervertebral disc degeneration, lumbar region: Secondary | ICD-10-CM

## 2016-09-24 DIAGNOSIS — D469 Myelodysplastic syndrome, unspecified: Secondary | ICD-10-CM | POA: Diagnosis not present

## 2016-09-24 MED ORDER — FOLDING WALKER MISC: 1.0000 | Freq: Every day | 0 refills | Status: AC

## 2016-09-24 NOTE — Assessment & Plan Note (Signed)
Had a fall with some pain in the right buttock and low back, pelvic x-rays were negative and I'm unable to reproduce his pain on exam today. He has home health physical therapy coming to the house, and over-the-counter Aleve seems to resolve his pain. Return as needed for this.

## 2016-09-24 NOTE — Assessment & Plan Note (Signed)
Recently had 2 units of blood as well as an Aranesp shot and an iron infusion, he will not be proceeding with any more Aranesp injections due to cost. He does have close follow-up with hematology.

## 2016-09-24 NOTE — Progress Notes (Signed)
  Subjective:    CC: Follow-up  HPI: Benjamin Fowler returns for follow-up after his fall, overall he is feeling better, and is usually pain-free with a bit of Aleve. He did have a pelvic x-ray that was negative for any fractures however I did see a hint of fairly severe lumbar degenerative disc disease and facet arthritis. Pain is mostly on the right buttock, worse with sitting, laying down. No new bowel or bladder dysfunction, radicular symptoms. He is currently doing home health physical therapy.  He did finally have a blood transfusion with 2 units as well as an iron infusion, overall feels better. He had a single injection of Aranesp however this is overall unaffordable.  Past medical history:  Negative.  See flowsheet/record as well for more information.  Surgical history: Negative.  See flowsheet/record as well for more information.  Family history: Negative.  See flowsheet/record as well for more information.  Social history: Negative.  See flowsheet/record as well for more information.  Allergies, and medications have been entered into the medical record, reviewed, and no changes needed.   Review of Systems: No fevers, chills, night sweats, weight loss, chest pain, or shortness of breath.   Objective:    General: Well Developed, well nourished, and in no acute distress.  Neuro: Alert and oriented x3, extra-ocular muscles intact, sensation grossly intact.  HEENT: Normocephalic, atraumatic, pupils equal round reactive to light, neck supple, no masses, no lymphadenopathy, thyroid nonpalpable.  Skin: Warm and dry, no rashes. Cardiac: Regular rate and rhythm, no murmurs rubs or gallops, no lower extremity edema.  Respiratory: Clear to auscultation bilaterally. Not using accessory muscles, speaking in full sentences. Right Hip: ROM IR: 60 Deg, ER: 60 Deg, Flexion: 120 Deg, Extension: 100 Deg, Abduction: 45 Deg, Adduction: 45 Deg Strength IR: 5/5, ER: 5/5, Flexion: 5/5, Extension: 5/5,  Abduction: 5/5, Adduction: 5/5 Pelvic alignment unremarkable to inspection and palpation. Standing hip rotation and gait without trendelenburg / unsteadiness. Greater trochanter without tenderness to palpation. No tenderness over piriformis. No SI joint tenderness and normal minimal SI movement. Back Exam:  Inspection: Unremarkable  Motion: Flexion 45 deg, Extension 45 deg, Side Bending to 45 deg bilaterally,  Rotation to 45 deg bilaterally  SLR laying: Negative  XSLR laying: Negative  Palpable tenderness: None. FABER: negative. Sensory change: Gross sensation intact to all lumbar and sacral dermatomes.  Reflexes: 2+ at both patellar tendons, 2+ at achilles tendons, Babinski's downgoing.  Strength at foot  Plantar-flexion: 5/5 Dorsi-flexion: 5/5 Eversion: 5/5 Inversion: 5/5  Leg strength  Quad: 5/5 Hamstring: 5/5 Hip flexor: 5/5 Hip abductors: 5/5  Gait unremarkable.  Impression and Recommendations:    Lumbar degenerative disc disease Had a fall with some pain in the right buttock and low back, pelvic x-rays were negative and I'm unable to reproduce his pain on exam today. He has home health physical therapy coming to the house, and over-the-counter Aleve seems to resolve his pain. Return as needed for this.  Myelodysplastic syndrome Recently had 2 units of blood as well as an Aranesp shot and an iron infusion, he will not be proceeding with any more Aranesp injections due to cost. He does have close follow-up with hematology.  I spent 25 minutes with this patient, greater than 50% was face-to-face time counseling regarding the above diagnoses

## 2016-09-25 ENCOUNTER — Ambulatory Visit: Payer: Medicare Other | Admitting: Sports Medicine

## 2016-09-26 ENCOUNTER — Telehealth: Payer: Self-pay

## 2016-09-26 MED ORDER — TRAMADOL HCL 50 MG PO TABS: ORAL_TABLET | ORAL | 0 refills | Status: AC

## 2016-09-26 NOTE — Telephone Encounter (Signed)
Son notified 

## 2016-09-26 NOTE — Telephone Encounter (Signed)
Son reports that dads hip is really bothering him today and its hard for him to get out of bed. He would like some recommendations. Also he asked for a refill of prednisone. Please advise.

## 2016-09-26 NOTE — Telephone Encounter (Signed)
That is much prednisone, adding tramadol. Prescription in box

## 2016-09-27 DIAGNOSIS — F0390 Unspecified dementia without behavioral disturbance: Secondary | ICD-10-CM | POA: Diagnosis not present

## 2016-09-27 DIAGNOSIS — I4891 Unspecified atrial fibrillation: Secondary | ICD-10-CM | POA: Diagnosis not present

## 2016-09-27 DIAGNOSIS — Z952 Presence of prosthetic heart valve: Secondary | ICD-10-CM | POA: Diagnosis not present

## 2016-09-27 DIAGNOSIS — R410 Disorientation, unspecified: Secondary | ICD-10-CM | POA: Diagnosis not present

## 2016-09-27 DIAGNOSIS — M25551 Pain in right hip: Secondary | ICD-10-CM | POA: Diagnosis not present

## 2016-09-27 DIAGNOSIS — D539 Nutritional anemia, unspecified: Secondary | ICD-10-CM | POA: Diagnosis not present

## 2016-09-27 DIAGNOSIS — D469 Myelodysplastic syndrome, unspecified: Secondary | ICD-10-CM | POA: Diagnosis not present

## 2016-09-27 DIAGNOSIS — Z66 Do not resuscitate: Secondary | ICD-10-CM | POA: Diagnosis not present

## 2016-09-27 DIAGNOSIS — N390 Urinary tract infection, site not specified: Secondary | ICD-10-CM | POA: Diagnosis not present

## 2016-09-27 DIAGNOSIS — R52 Pain, unspecified: Secondary | ICD-10-CM | POA: Diagnosis not present

## 2016-09-27 DIAGNOSIS — R4182 Altered mental status, unspecified: Secondary | ICD-10-CM | POA: Diagnosis not present

## 2016-09-27 DIAGNOSIS — I10 Essential (primary) hypertension: Secondary | ICD-10-CM | POA: Diagnosis not present

## 2016-09-27 DIAGNOSIS — F0280 Dementia in other diseases classified elsewhere without behavioral disturbance: Secondary | ICD-10-CM | POA: Diagnosis not present

## 2016-09-27 DIAGNOSIS — M25552 Pain in left hip: Secondary | ICD-10-CM | POA: Diagnosis not present

## 2016-09-27 DIAGNOSIS — Z9181 History of falling: Secondary | ICD-10-CM | POA: Diagnosis not present

## 2016-09-27 DIAGNOSIS — R531 Weakness: Secondary | ICD-10-CM | POA: Diagnosis not present

## 2016-09-27 DIAGNOSIS — R001 Bradycardia, unspecified: Secondary | ICD-10-CM | POA: Diagnosis not present

## 2016-09-27 DIAGNOSIS — R000 Tachycardia, unspecified: Secondary | ICD-10-CM | POA: Diagnosis not present

## 2016-09-27 DIAGNOSIS — R609 Edema, unspecified: Secondary | ICD-10-CM | POA: Diagnosis not present

## 2016-09-27 DIAGNOSIS — I499 Cardiac arrhythmia, unspecified: Secondary | ICD-10-CM | POA: Diagnosis not present

## 2016-09-27 DIAGNOSIS — G309 Alzheimer's disease, unspecified: Secondary | ICD-10-CM | POA: Diagnosis not present

## 2016-09-27 DIAGNOSIS — Z7982 Long term (current) use of aspirin: Secondary | ICD-10-CM | POA: Diagnosis not present

## 2016-09-27 DIAGNOSIS — R278 Other lack of coordination: Secondary | ICD-10-CM | POA: Diagnosis not present

## 2016-09-27 DIAGNOSIS — B9620 Unspecified Escherichia coli [E. coli] as the cause of diseases classified elsewhere: Secondary | ICD-10-CM | POA: Diagnosis not present

## 2016-09-27 DIAGNOSIS — M1990 Unspecified osteoarthritis, unspecified site: Secondary | ICD-10-CM | POA: Diagnosis not present

## 2016-09-27 DIAGNOSIS — R0602 Shortness of breath: Secondary | ICD-10-CM | POA: Diagnosis not present

## 2016-09-27 DIAGNOSIS — M6281 Muscle weakness (generalized): Secondary | ICD-10-CM | POA: Diagnosis not present

## 2016-09-28 DIAGNOSIS — R410 Disorientation, unspecified: Secondary | ICD-10-CM | POA: Diagnosis not present

## 2016-09-28 DIAGNOSIS — R001 Bradycardia, unspecified: Secondary | ICD-10-CM | POA: Diagnosis not present

## 2016-09-28 DIAGNOSIS — N390 Urinary tract infection, site not specified: Secondary | ICD-10-CM | POA: Diagnosis not present

## 2016-09-28 DIAGNOSIS — I4891 Unspecified atrial fibrillation: Secondary | ICD-10-CM | POA: Diagnosis not present

## 2016-09-28 DIAGNOSIS — I10 Essential (primary) hypertension: Secondary | ICD-10-CM | POA: Diagnosis not present

## 2016-09-28 DIAGNOSIS — M6281 Muscle weakness (generalized): Secondary | ICD-10-CM | POA: Diagnosis not present

## 2016-09-28 DIAGNOSIS — F0280 Dementia in other diseases classified elsewhere without behavioral disturbance: Secondary | ICD-10-CM | POA: Diagnosis not present

## 2016-09-28 DIAGNOSIS — D539 Nutritional anemia, unspecified: Secondary | ICD-10-CM | POA: Diagnosis not present

## 2016-09-28 DIAGNOSIS — G309 Alzheimer's disease, unspecified: Secondary | ICD-10-CM | POA: Diagnosis not present

## 2016-09-28 DIAGNOSIS — Z952 Presence of prosthetic heart valve: Secondary | ICD-10-CM | POA: Diagnosis not present

## 2016-09-28 DIAGNOSIS — Z66 Do not resuscitate: Secondary | ICD-10-CM | POA: Diagnosis not present

## 2016-09-28 DIAGNOSIS — F0390 Unspecified dementia without behavioral disturbance: Secondary | ICD-10-CM | POA: Diagnosis not present

## 2016-09-29 DIAGNOSIS — D539 Nutritional anemia, unspecified: Secondary | ICD-10-CM | POA: Diagnosis not present

## 2016-09-29 DIAGNOSIS — Z66 Do not resuscitate: Secondary | ICD-10-CM | POA: Diagnosis not present

## 2016-09-29 DIAGNOSIS — G309 Alzheimer's disease, unspecified: Secondary | ICD-10-CM | POA: Diagnosis not present

## 2016-09-29 DIAGNOSIS — F0280 Dementia in other diseases classified elsewhere without behavioral disturbance: Secondary | ICD-10-CM | POA: Diagnosis not present

## 2016-09-29 DIAGNOSIS — M6281 Muscle weakness (generalized): Secondary | ICD-10-CM | POA: Diagnosis not present

## 2016-09-29 DIAGNOSIS — I4891 Unspecified atrial fibrillation: Secondary | ICD-10-CM | POA: Diagnosis not present

## 2016-09-29 DIAGNOSIS — N390 Urinary tract infection, site not specified: Secondary | ICD-10-CM | POA: Diagnosis not present

## 2016-09-29 DIAGNOSIS — I10 Essential (primary) hypertension: Secondary | ICD-10-CM | POA: Diagnosis not present

## 2016-09-29 DIAGNOSIS — R410 Disorientation, unspecified: Secondary | ICD-10-CM | POA: Diagnosis not present

## 2016-09-29 DIAGNOSIS — R001 Bradycardia, unspecified: Secondary | ICD-10-CM | POA: Diagnosis not present

## 2016-09-30 DIAGNOSIS — N390 Urinary tract infection, site not specified: Secondary | ICD-10-CM | POA: Diagnosis not present

## 2016-09-30 DIAGNOSIS — G309 Alzheimer's disease, unspecified: Secondary | ICD-10-CM | POA: Diagnosis not present

## 2016-09-30 DIAGNOSIS — Z952 Presence of prosthetic heart valve: Secondary | ICD-10-CM | POA: Diagnosis not present

## 2016-09-30 DIAGNOSIS — D539 Nutritional anemia, unspecified: Secondary | ICD-10-CM | POA: Diagnosis not present

## 2016-09-30 DIAGNOSIS — M6281 Muscle weakness (generalized): Secondary | ICD-10-CM | POA: Diagnosis not present

## 2016-09-30 DIAGNOSIS — I4891 Unspecified atrial fibrillation: Secondary | ICD-10-CM | POA: Diagnosis not present

## 2016-09-30 DIAGNOSIS — I10 Essential (primary) hypertension: Secondary | ICD-10-CM | POA: Diagnosis not present

## 2016-09-30 DIAGNOSIS — R000 Tachycardia, unspecified: Secondary | ICD-10-CM | POA: Diagnosis not present

## 2016-09-30 DIAGNOSIS — F0280 Dementia in other diseases classified elsewhere without behavioral disturbance: Secondary | ICD-10-CM | POA: Diagnosis not present

## 2016-09-30 DIAGNOSIS — Z9181 History of falling: Secondary | ICD-10-CM | POA: Diagnosis not present

## 2016-10-01 DIAGNOSIS — M79674 Pain in right toe(s): Secondary | ICD-10-CM | POA: Diagnosis not present

## 2016-10-01 DIAGNOSIS — G309 Alzheimer's disease, unspecified: Secondary | ICD-10-CM | POA: Diagnosis not present

## 2016-10-01 DIAGNOSIS — M6281 Muscle weakness (generalized): Secondary | ICD-10-CM | POA: Diagnosis not present

## 2016-10-01 DIAGNOSIS — R531 Weakness: Secondary | ICD-10-CM | POA: Diagnosis not present

## 2016-10-01 DIAGNOSIS — D539 Nutritional anemia, unspecified: Secondary | ICD-10-CM | POA: Diagnosis not present

## 2016-10-01 DIAGNOSIS — M25552 Pain in left hip: Secondary | ICD-10-CM | POA: Diagnosis not present

## 2016-10-01 DIAGNOSIS — M79675 Pain in left toe(s): Secondary | ICD-10-CM | POA: Diagnosis not present

## 2016-10-01 DIAGNOSIS — I10 Essential (primary) hypertension: Secondary | ICD-10-CM | POA: Diagnosis not present

## 2016-10-01 DIAGNOSIS — B351 Tinea unguium: Secondary | ICD-10-CM | POA: Diagnosis not present

## 2016-10-01 DIAGNOSIS — R278 Other lack of coordination: Secondary | ICD-10-CM | POA: Diagnosis not present

## 2016-10-01 DIAGNOSIS — R05 Cough: Secondary | ICD-10-CM | POA: Diagnosis not present

## 2016-10-01 DIAGNOSIS — N390 Urinary tract infection, site not specified: Secondary | ICD-10-CM | POA: Diagnosis not present

## 2016-10-01 DIAGNOSIS — B9620 Unspecified Escherichia coli [E. coli] as the cause of diseases classified elsewhere: Secondary | ICD-10-CM | POA: Diagnosis not present

## 2016-10-01 DIAGNOSIS — F0280 Dementia in other diseases classified elsewhere without behavioral disturbance: Secondary | ICD-10-CM | POA: Diagnosis not present

## 2016-10-01 DIAGNOSIS — F0390 Unspecified dementia without behavioral disturbance: Secondary | ICD-10-CM | POA: Diagnosis not present

## 2016-10-01 DIAGNOSIS — I4891 Unspecified atrial fibrillation: Secondary | ICD-10-CM | POA: Diagnosis not present

## 2016-10-01 DIAGNOSIS — R001 Bradycardia, unspecified: Secondary | ICD-10-CM | POA: Diagnosis not present

## 2016-10-01 DIAGNOSIS — M1990 Unspecified osteoarthritis, unspecified site: Secondary | ICD-10-CM | POA: Diagnosis not present

## 2016-10-01 DIAGNOSIS — D649 Anemia, unspecified: Secondary | ICD-10-CM | POA: Diagnosis not present

## 2016-10-01 DIAGNOSIS — M25551 Pain in right hip: Secondary | ICD-10-CM | POA: Diagnosis not present

## 2016-10-01 DIAGNOSIS — R410 Disorientation, unspecified: Secondary | ICD-10-CM | POA: Diagnosis not present

## 2016-10-07 DIAGNOSIS — N390 Urinary tract infection, site not specified: Secondary | ICD-10-CM | POA: Diagnosis not present

## 2016-10-07 DIAGNOSIS — R531 Weakness: Secondary | ICD-10-CM | POA: Diagnosis not present

## 2016-10-07 DIAGNOSIS — M1990 Unspecified osteoarthritis, unspecified site: Secondary | ICD-10-CM | POA: Diagnosis not present

## 2016-10-07 DIAGNOSIS — F0390 Unspecified dementia without behavioral disturbance: Secondary | ICD-10-CM | POA: Diagnosis not present

## 2016-10-07 DIAGNOSIS — I10 Essential (primary) hypertension: Secondary | ICD-10-CM | POA: Diagnosis not present

## 2016-10-23 DIAGNOSIS — B351 Tinea unguium: Secondary | ICD-10-CM | POA: Diagnosis not present

## 2016-10-23 DIAGNOSIS — M79675 Pain in left toe(s): Secondary | ICD-10-CM | POA: Diagnosis not present

## 2016-10-23 DIAGNOSIS — M79674 Pain in right toe(s): Secondary | ICD-10-CM | POA: Diagnosis not present

## 2016-10-25 DIAGNOSIS — R531 Weakness: Secondary | ICD-10-CM | POA: Diagnosis not present

## 2016-10-25 DIAGNOSIS — M25551 Pain in right hip: Secondary | ICD-10-CM | POA: Diagnosis not present

## 2016-10-25 DIAGNOSIS — M25552 Pain in left hip: Secondary | ICD-10-CM | POA: Diagnosis not present

## 2016-10-25 DIAGNOSIS — M6281 Muscle weakness (generalized): Secondary | ICD-10-CM | POA: Diagnosis not present

## 2016-10-25 DIAGNOSIS — R278 Other lack of coordination: Secondary | ICD-10-CM | POA: Diagnosis not present

## 2016-10-28 DIAGNOSIS — D649 Anemia, unspecified: Secondary | ICD-10-CM | POA: Diagnosis not present

## 2016-10-28 DIAGNOSIS — N390 Urinary tract infection, site not specified: Secondary | ICD-10-CM | POA: Diagnosis not present

## 2016-10-28 DIAGNOSIS — F0390 Unspecified dementia without behavioral disturbance: Secondary | ICD-10-CM | POA: Diagnosis not present

## 2016-10-28 DIAGNOSIS — R05 Cough: Secondary | ICD-10-CM | POA: Diagnosis not present

## 2016-10-28 DIAGNOSIS — R001 Bradycardia, unspecified: Secondary | ICD-10-CM | POA: Diagnosis not present

## 2016-10-28 DIAGNOSIS — I4891 Unspecified atrial fibrillation: Secondary | ICD-10-CM | POA: Diagnosis not present

## 2016-11-11 DIAGNOSIS — R05 Cough: Secondary | ICD-10-CM | POA: Diagnosis not present

## 2016-11-18 DIAGNOSIS — I4891 Unspecified atrial fibrillation: Secondary | ICD-10-CM | POA: Diagnosis not present

## 2016-11-18 DIAGNOSIS — R531 Weakness: Secondary | ICD-10-CM | POA: Diagnosis not present

## 2016-11-18 DIAGNOSIS — F0390 Unspecified dementia without behavioral disturbance: Secondary | ICD-10-CM | POA: Diagnosis not present

## 2016-11-18 DIAGNOSIS — D649 Anemia, unspecified: Secondary | ICD-10-CM | POA: Diagnosis not present

## 2016-11-19 DIAGNOSIS — D649 Anemia, unspecified: Secondary | ICD-10-CM | POA: Diagnosis not present

## 2016-11-21 DIAGNOSIS — M25552 Pain in left hip: Secondary | ICD-10-CM | POA: Diagnosis not present

## 2016-11-21 DIAGNOSIS — R531 Weakness: Secondary | ICD-10-CM | POA: Diagnosis not present

## 2016-11-21 DIAGNOSIS — R278 Other lack of coordination: Secondary | ICD-10-CM | POA: Diagnosis not present

## 2016-11-21 DIAGNOSIS — M6281 Muscle weakness (generalized): Secondary | ICD-10-CM | POA: Diagnosis not present

## 2016-11-21 DIAGNOSIS — M25551 Pain in right hip: Secondary | ICD-10-CM | POA: Diagnosis not present

## 2016-11-22 DIAGNOSIS — M6281 Muscle weakness (generalized): Secondary | ICD-10-CM | POA: Diagnosis not present

## 2016-11-22 DIAGNOSIS — R531 Weakness: Secondary | ICD-10-CM | POA: Diagnosis not present

## 2016-11-22 DIAGNOSIS — M25551 Pain in right hip: Secondary | ICD-10-CM | POA: Diagnosis not present

## 2016-11-22 DIAGNOSIS — R278 Other lack of coordination: Secondary | ICD-10-CM | POA: Diagnosis not present

## 2016-11-22 DIAGNOSIS — M25552 Pain in left hip: Secondary | ICD-10-CM | POA: Diagnosis not present

## 2016-11-25 DIAGNOSIS — M25552 Pain in left hip: Secondary | ICD-10-CM | POA: Diagnosis not present

## 2016-11-25 DIAGNOSIS — R278 Other lack of coordination: Secondary | ICD-10-CM | POA: Diagnosis not present

## 2016-11-25 DIAGNOSIS — M6281 Muscle weakness (generalized): Secondary | ICD-10-CM | POA: Diagnosis not present

## 2016-11-25 DIAGNOSIS — M25551 Pain in right hip: Secondary | ICD-10-CM | POA: Diagnosis not present

## 2016-11-25 DIAGNOSIS — R531 Weakness: Secondary | ICD-10-CM | POA: Diagnosis not present

## 2016-11-26 DIAGNOSIS — R531 Weakness: Secondary | ICD-10-CM | POA: Diagnosis not present

## 2016-11-26 DIAGNOSIS — M6281 Muscle weakness (generalized): Secondary | ICD-10-CM | POA: Diagnosis not present

## 2016-11-26 DIAGNOSIS — M25552 Pain in left hip: Secondary | ICD-10-CM | POA: Diagnosis not present

## 2016-11-26 DIAGNOSIS — R278 Other lack of coordination: Secondary | ICD-10-CM | POA: Diagnosis not present

## 2016-11-26 DIAGNOSIS — M25551 Pain in right hip: Secondary | ICD-10-CM | POA: Diagnosis not present

## 2016-11-27 DIAGNOSIS — R278 Other lack of coordination: Secondary | ICD-10-CM | POA: Diagnosis not present

## 2016-11-27 DIAGNOSIS — M6281 Muscle weakness (generalized): Secondary | ICD-10-CM | POA: Diagnosis not present

## 2016-11-27 DIAGNOSIS — R531 Weakness: Secondary | ICD-10-CM | POA: Diagnosis not present

## 2016-11-27 DIAGNOSIS — M25552 Pain in left hip: Secondary | ICD-10-CM | POA: Diagnosis not present

## 2016-11-27 DIAGNOSIS — M25551 Pain in right hip: Secondary | ICD-10-CM | POA: Diagnosis not present

## 2016-11-28 DIAGNOSIS — M6281 Muscle weakness (generalized): Secondary | ICD-10-CM | POA: Diagnosis not present

## 2016-11-28 DIAGNOSIS — R278 Other lack of coordination: Secondary | ICD-10-CM | POA: Diagnosis not present

## 2016-11-28 DIAGNOSIS — M25551 Pain in right hip: Secondary | ICD-10-CM | POA: Diagnosis not present

## 2016-11-28 DIAGNOSIS — M25552 Pain in left hip: Secondary | ICD-10-CM | POA: Diagnosis not present

## 2016-11-28 DIAGNOSIS — R531 Weakness: Secondary | ICD-10-CM | POA: Diagnosis not present

## 2016-11-29 DIAGNOSIS — M25551 Pain in right hip: Secondary | ICD-10-CM | POA: Diagnosis not present

## 2016-11-29 DIAGNOSIS — M25552 Pain in left hip: Secondary | ICD-10-CM | POA: Diagnosis not present

## 2016-11-29 DIAGNOSIS — R278 Other lack of coordination: Secondary | ICD-10-CM | POA: Diagnosis not present

## 2016-11-29 DIAGNOSIS — R531 Weakness: Secondary | ICD-10-CM | POA: Diagnosis not present

## 2016-11-29 DIAGNOSIS — M6281 Muscle weakness (generalized): Secondary | ICD-10-CM | POA: Diagnosis not present

## 2016-12-02 DIAGNOSIS — R278 Other lack of coordination: Secondary | ICD-10-CM | POA: Diagnosis not present

## 2016-12-02 DIAGNOSIS — M6281 Muscle weakness (generalized): Secondary | ICD-10-CM | POA: Diagnosis not present

## 2016-12-02 DIAGNOSIS — R531 Weakness: Secondary | ICD-10-CM | POA: Diagnosis not present

## 2016-12-02 DIAGNOSIS — M25551 Pain in right hip: Secondary | ICD-10-CM | POA: Diagnosis not present

## 2016-12-02 DIAGNOSIS — M25552 Pain in left hip: Secondary | ICD-10-CM | POA: Diagnosis not present

## 2016-12-03 DIAGNOSIS — R531 Weakness: Secondary | ICD-10-CM | POA: Diagnosis not present

## 2016-12-03 DIAGNOSIS — M25551 Pain in right hip: Secondary | ICD-10-CM | POA: Diagnosis not present

## 2016-12-03 DIAGNOSIS — M25552 Pain in left hip: Secondary | ICD-10-CM | POA: Diagnosis not present

## 2016-12-03 DIAGNOSIS — R278 Other lack of coordination: Secondary | ICD-10-CM | POA: Diagnosis not present

## 2016-12-03 DIAGNOSIS — M6281 Muscle weakness (generalized): Secondary | ICD-10-CM | POA: Diagnosis not present

## 2016-12-04 DIAGNOSIS — R531 Weakness: Secondary | ICD-10-CM | POA: Diagnosis not present

## 2016-12-04 DIAGNOSIS — R278 Other lack of coordination: Secondary | ICD-10-CM | POA: Diagnosis not present

## 2016-12-04 DIAGNOSIS — M6281 Muscle weakness (generalized): Secondary | ICD-10-CM | POA: Diagnosis not present

## 2016-12-04 DIAGNOSIS — M25551 Pain in right hip: Secondary | ICD-10-CM | POA: Diagnosis not present

## 2016-12-04 DIAGNOSIS — M25552 Pain in left hip: Secondary | ICD-10-CM | POA: Diagnosis not present

## 2016-12-05 DIAGNOSIS — M25551 Pain in right hip: Secondary | ICD-10-CM | POA: Diagnosis not present

## 2016-12-05 DIAGNOSIS — M25552 Pain in left hip: Secondary | ICD-10-CM | POA: Diagnosis not present

## 2016-12-05 DIAGNOSIS — R531 Weakness: Secondary | ICD-10-CM | POA: Diagnosis not present

## 2016-12-05 DIAGNOSIS — R278 Other lack of coordination: Secondary | ICD-10-CM | POA: Diagnosis not present

## 2016-12-05 DIAGNOSIS — M6281 Muscle weakness (generalized): Secondary | ICD-10-CM | POA: Diagnosis not present

## 2016-12-06 DIAGNOSIS — R278 Other lack of coordination: Secondary | ICD-10-CM | POA: Diagnosis not present

## 2016-12-06 DIAGNOSIS — M25551 Pain in right hip: Secondary | ICD-10-CM | POA: Diagnosis not present

## 2016-12-06 DIAGNOSIS — M25552 Pain in left hip: Secondary | ICD-10-CM | POA: Diagnosis not present

## 2016-12-06 DIAGNOSIS — R531 Weakness: Secondary | ICD-10-CM | POA: Diagnosis not present

## 2016-12-06 DIAGNOSIS — M6281 Muscle weakness (generalized): Secondary | ICD-10-CM | POA: Diagnosis not present

## 2016-12-09 DIAGNOSIS — M25552 Pain in left hip: Secondary | ICD-10-CM | POA: Diagnosis not present

## 2016-12-09 DIAGNOSIS — R278 Other lack of coordination: Secondary | ICD-10-CM | POA: Diagnosis not present

## 2016-12-09 DIAGNOSIS — M6281 Muscle weakness (generalized): Secondary | ICD-10-CM | POA: Diagnosis not present

## 2016-12-09 DIAGNOSIS — M25551 Pain in right hip: Secondary | ICD-10-CM | POA: Diagnosis not present

## 2016-12-09 DIAGNOSIS — R531 Weakness: Secondary | ICD-10-CM | POA: Diagnosis not present

## 2016-12-10 DIAGNOSIS — M25551 Pain in right hip: Secondary | ICD-10-CM | POA: Diagnosis not present

## 2016-12-10 DIAGNOSIS — R531 Weakness: Secondary | ICD-10-CM | POA: Diagnosis not present

## 2016-12-10 DIAGNOSIS — D649 Anemia, unspecified: Secondary | ICD-10-CM | POA: Diagnosis not present

## 2016-12-10 DIAGNOSIS — M6281 Muscle weakness (generalized): Secondary | ICD-10-CM | POA: Diagnosis not present

## 2016-12-10 DIAGNOSIS — M25552 Pain in left hip: Secondary | ICD-10-CM | POA: Diagnosis not present

## 2016-12-10 DIAGNOSIS — R278 Other lack of coordination: Secondary | ICD-10-CM | POA: Diagnosis not present

## 2016-12-11 DIAGNOSIS — R531 Weakness: Secondary | ICD-10-CM | POA: Diagnosis not present

## 2016-12-11 DIAGNOSIS — R278 Other lack of coordination: Secondary | ICD-10-CM | POA: Diagnosis not present

## 2016-12-11 DIAGNOSIS — M25551 Pain in right hip: Secondary | ICD-10-CM | POA: Diagnosis not present

## 2016-12-11 DIAGNOSIS — M6281 Muscle weakness (generalized): Secondary | ICD-10-CM | POA: Diagnosis not present

## 2016-12-11 DIAGNOSIS — M25552 Pain in left hip: Secondary | ICD-10-CM | POA: Diagnosis not present

## 2016-12-12 DIAGNOSIS — M6281 Muscle weakness (generalized): Secondary | ICD-10-CM | POA: Diagnosis not present

## 2016-12-12 DIAGNOSIS — R531 Weakness: Secondary | ICD-10-CM | POA: Diagnosis not present

## 2016-12-12 DIAGNOSIS — M25552 Pain in left hip: Secondary | ICD-10-CM | POA: Diagnosis not present

## 2016-12-12 DIAGNOSIS — R278 Other lack of coordination: Secondary | ICD-10-CM | POA: Diagnosis not present

## 2016-12-12 DIAGNOSIS — M25551 Pain in right hip: Secondary | ICD-10-CM | POA: Diagnosis not present

## 2016-12-13 DIAGNOSIS — R278 Other lack of coordination: Secondary | ICD-10-CM | POA: Diagnosis not present

## 2016-12-13 DIAGNOSIS — M25551 Pain in right hip: Secondary | ICD-10-CM | POA: Diagnosis not present

## 2016-12-13 DIAGNOSIS — R531 Weakness: Secondary | ICD-10-CM | POA: Diagnosis not present

## 2016-12-13 DIAGNOSIS — M25552 Pain in left hip: Secondary | ICD-10-CM | POA: Diagnosis not present

## 2016-12-13 DIAGNOSIS — M6281 Muscle weakness (generalized): Secondary | ICD-10-CM | POA: Diagnosis not present

## 2016-12-16 DIAGNOSIS — R278 Other lack of coordination: Secondary | ICD-10-CM | POA: Diagnosis not present

## 2016-12-16 DIAGNOSIS — I4891 Unspecified atrial fibrillation: Secondary | ICD-10-CM | POA: Diagnosis not present

## 2016-12-16 DIAGNOSIS — D649 Anemia, unspecified: Secondary | ICD-10-CM | POA: Diagnosis not present

## 2016-12-16 DIAGNOSIS — M25551 Pain in right hip: Secondary | ICD-10-CM | POA: Diagnosis not present

## 2016-12-16 DIAGNOSIS — R531 Weakness: Secondary | ICD-10-CM | POA: Diagnosis not present

## 2016-12-16 DIAGNOSIS — I10 Essential (primary) hypertension: Secondary | ICD-10-CM | POA: Diagnosis not present

## 2016-12-16 DIAGNOSIS — M25552 Pain in left hip: Secondary | ICD-10-CM | POA: Diagnosis not present

## 2016-12-16 DIAGNOSIS — M6281 Muscle weakness (generalized): Secondary | ICD-10-CM | POA: Diagnosis not present

## 2016-12-16 DIAGNOSIS — F0390 Unspecified dementia without behavioral disturbance: Secondary | ICD-10-CM | POA: Diagnosis not present

## 2016-12-17 DIAGNOSIS — M25552 Pain in left hip: Secondary | ICD-10-CM | POA: Diagnosis not present

## 2016-12-17 DIAGNOSIS — M6281 Muscle weakness (generalized): Secondary | ICD-10-CM | POA: Diagnosis not present

## 2016-12-17 DIAGNOSIS — R278 Other lack of coordination: Secondary | ICD-10-CM | POA: Diagnosis not present

## 2016-12-17 DIAGNOSIS — R531 Weakness: Secondary | ICD-10-CM | POA: Diagnosis not present

## 2016-12-17 DIAGNOSIS — M25551 Pain in right hip: Secondary | ICD-10-CM | POA: Diagnosis not present

## 2016-12-18 DIAGNOSIS — M25552 Pain in left hip: Secondary | ICD-10-CM | POA: Diagnosis not present

## 2016-12-18 DIAGNOSIS — M6281 Muscle weakness (generalized): Secondary | ICD-10-CM | POA: Diagnosis not present

## 2016-12-18 DIAGNOSIS — M25551 Pain in right hip: Secondary | ICD-10-CM | POA: Diagnosis not present

## 2016-12-18 DIAGNOSIS — R531 Weakness: Secondary | ICD-10-CM | POA: Diagnosis not present

## 2016-12-18 DIAGNOSIS — R278 Other lack of coordination: Secondary | ICD-10-CM | POA: Diagnosis not present

## 2016-12-24 ENCOUNTER — Ambulatory Visit: Payer: Medicare Other | Admitting: Sports Medicine

## 2017-01-07 DIAGNOSIS — M79675 Pain in left toe(s): Secondary | ICD-10-CM | POA: Diagnosis not present

## 2017-01-07 DIAGNOSIS — M79674 Pain in right toe(s): Secondary | ICD-10-CM | POA: Diagnosis not present

## 2017-01-07 DIAGNOSIS — B351 Tinea unguium: Secondary | ICD-10-CM | POA: Diagnosis not present

## 2017-01-20 DIAGNOSIS — I4891 Unspecified atrial fibrillation: Secondary | ICD-10-CM | POA: Diagnosis not present

## 2017-01-20 DIAGNOSIS — F0390 Unspecified dementia without behavioral disturbance: Secondary | ICD-10-CM | POA: Diagnosis not present

## 2017-01-20 DIAGNOSIS — K5900 Constipation, unspecified: Secondary | ICD-10-CM | POA: Diagnosis not present

## 2017-01-20 DIAGNOSIS — I10 Essential (primary) hypertension: Secondary | ICD-10-CM | POA: Diagnosis not present

## 2017-02-10 DIAGNOSIS — M25511 Pain in right shoulder: Secondary | ICD-10-CM | POA: Diagnosis not present

## 2017-02-10 DIAGNOSIS — H903 Sensorineural hearing loss, bilateral: Secondary | ICD-10-CM | POA: Diagnosis not present

## 2017-02-11 DIAGNOSIS — R2681 Unsteadiness on feet: Secondary | ICD-10-CM | POA: Diagnosis not present

## 2017-02-11 DIAGNOSIS — M6281 Muscle weakness (generalized): Secondary | ICD-10-CM | POA: Diagnosis not present

## 2017-02-11 DIAGNOSIS — R278 Other lack of coordination: Secondary | ICD-10-CM | POA: Diagnosis not present

## 2017-02-11 DIAGNOSIS — R531 Weakness: Secondary | ICD-10-CM | POA: Diagnosis not present

## 2017-02-13 DIAGNOSIS — R531 Weakness: Secondary | ICD-10-CM | POA: Diagnosis not present

## 2017-02-14 DIAGNOSIS — R531 Weakness: Secondary | ICD-10-CM | POA: Diagnosis not present

## 2017-02-17 DIAGNOSIS — R531 Weakness: Secondary | ICD-10-CM | POA: Diagnosis not present

## 2017-02-17 DIAGNOSIS — D649 Anemia, unspecified: Secondary | ICD-10-CM | POA: Diagnosis not present

## 2017-02-17 DIAGNOSIS — I10 Essential (primary) hypertension: Secondary | ICD-10-CM | POA: Diagnosis not present

## 2017-02-17 DIAGNOSIS — I4891 Unspecified atrial fibrillation: Secondary | ICD-10-CM | POA: Diagnosis not present

## 2017-02-17 DIAGNOSIS — M1990 Unspecified osteoarthritis, unspecified site: Secondary | ICD-10-CM | POA: Diagnosis not present

## 2017-02-18 DIAGNOSIS — H9193 Unspecified hearing loss, bilateral: Secondary | ICD-10-CM | POA: Diagnosis not present

## 2017-02-18 DIAGNOSIS — H903 Sensorineural hearing loss, bilateral: Secondary | ICD-10-CM | POA: Diagnosis not present

## 2017-02-19 DIAGNOSIS — M25511 Pain in right shoulder: Secondary | ICD-10-CM | POA: Diagnosis not present

## 2017-02-19 DIAGNOSIS — R2681 Unsteadiness on feet: Secondary | ICD-10-CM | POA: Diagnosis not present

## 2017-02-19 DIAGNOSIS — R531 Weakness: Secondary | ICD-10-CM | POA: Diagnosis not present

## 2017-02-19 DIAGNOSIS — R278 Other lack of coordination: Secondary | ICD-10-CM | POA: Diagnosis not present

## 2017-02-19 DIAGNOSIS — M19011 Primary osteoarthritis, right shoulder: Secondary | ICD-10-CM | POA: Diagnosis not present

## 2017-02-19 DIAGNOSIS — M6281 Muscle weakness (generalized): Secondary | ICD-10-CM | POA: Diagnosis not present

## 2017-02-20 DIAGNOSIS — M25511 Pain in right shoulder: Secondary | ICD-10-CM | POA: Diagnosis not present

## 2017-02-20 DIAGNOSIS — M19011 Primary osteoarthritis, right shoulder: Secondary | ICD-10-CM | POA: Diagnosis not present

## 2017-02-20 DIAGNOSIS — R278 Other lack of coordination: Secondary | ICD-10-CM | POA: Diagnosis not present

## 2017-02-20 DIAGNOSIS — R2681 Unsteadiness on feet: Secondary | ICD-10-CM | POA: Diagnosis not present

## 2017-02-20 DIAGNOSIS — R531 Weakness: Secondary | ICD-10-CM | POA: Diagnosis not present

## 2017-02-20 DIAGNOSIS — M6281 Muscle weakness (generalized): Secondary | ICD-10-CM | POA: Diagnosis not present

## 2017-02-21 DIAGNOSIS — R531 Weakness: Secondary | ICD-10-CM | POA: Diagnosis not present

## 2017-02-21 DIAGNOSIS — R278 Other lack of coordination: Secondary | ICD-10-CM | POA: Diagnosis not present

## 2017-02-21 DIAGNOSIS — R2681 Unsteadiness on feet: Secondary | ICD-10-CM | POA: Diagnosis not present

## 2017-02-21 DIAGNOSIS — M19011 Primary osteoarthritis, right shoulder: Secondary | ICD-10-CM | POA: Diagnosis not present

## 2017-02-21 DIAGNOSIS — M25511 Pain in right shoulder: Secondary | ICD-10-CM | POA: Diagnosis not present

## 2017-02-21 DIAGNOSIS — M6281 Muscle weakness (generalized): Secondary | ICD-10-CM | POA: Diagnosis not present

## 2017-02-24 DIAGNOSIS — R531 Weakness: Secondary | ICD-10-CM | POA: Diagnosis not present

## 2017-02-25 DIAGNOSIS — R531 Weakness: Secondary | ICD-10-CM | POA: Diagnosis not present

## 2017-02-26 DIAGNOSIS — M25511 Pain in right shoulder: Secondary | ICD-10-CM | POA: Diagnosis not present

## 2017-02-26 DIAGNOSIS — M19011 Primary osteoarthritis, right shoulder: Secondary | ICD-10-CM | POA: Diagnosis not present

## 2017-02-26 DIAGNOSIS — M25311 Other instability, right shoulder: Secondary | ICD-10-CM | POA: Diagnosis not present

## 2017-02-26 DIAGNOSIS — R531 Weakness: Secondary | ICD-10-CM | POA: Diagnosis not present

## 2017-02-27 DIAGNOSIS — R531 Weakness: Secondary | ICD-10-CM | POA: Diagnosis not present

## 2017-02-28 DIAGNOSIS — M19011 Primary osteoarthritis, right shoulder: Secondary | ICD-10-CM | POA: Diagnosis not present

## 2017-02-28 DIAGNOSIS — R278 Other lack of coordination: Secondary | ICD-10-CM | POA: Diagnosis not present

## 2017-02-28 DIAGNOSIS — M25511 Pain in right shoulder: Secondary | ICD-10-CM | POA: Diagnosis not present

## 2017-02-28 DIAGNOSIS — R531 Weakness: Secondary | ICD-10-CM | POA: Diagnosis not present

## 2017-02-28 DIAGNOSIS — M6281 Muscle weakness (generalized): Secondary | ICD-10-CM | POA: Diagnosis not present

## 2017-02-28 DIAGNOSIS — R2681 Unsteadiness on feet: Secondary | ICD-10-CM | POA: Diagnosis not present

## 2017-03-03 DIAGNOSIS — R278 Other lack of coordination: Secondary | ICD-10-CM | POA: Diagnosis not present

## 2017-03-03 DIAGNOSIS — M25511 Pain in right shoulder: Secondary | ICD-10-CM | POA: Diagnosis not present

## 2017-03-03 DIAGNOSIS — M19011 Primary osteoarthritis, right shoulder: Secondary | ICD-10-CM | POA: Diagnosis not present

## 2017-03-03 DIAGNOSIS — R531 Weakness: Secondary | ICD-10-CM | POA: Diagnosis not present

## 2017-03-03 DIAGNOSIS — R2681 Unsteadiness on feet: Secondary | ICD-10-CM | POA: Diagnosis not present

## 2017-03-03 DIAGNOSIS — M6281 Muscle weakness (generalized): Secondary | ICD-10-CM | POA: Diagnosis not present

## 2017-03-04 DIAGNOSIS — M6281 Muscle weakness (generalized): Secondary | ICD-10-CM | POA: Diagnosis not present

## 2017-03-04 DIAGNOSIS — R2681 Unsteadiness on feet: Secondary | ICD-10-CM | POA: Diagnosis not present

## 2017-03-04 DIAGNOSIS — M25511 Pain in right shoulder: Secondary | ICD-10-CM | POA: Diagnosis not present

## 2017-03-04 DIAGNOSIS — R531 Weakness: Secondary | ICD-10-CM | POA: Diagnosis not present

## 2017-03-04 DIAGNOSIS — R278 Other lack of coordination: Secondary | ICD-10-CM | POA: Diagnosis not present

## 2017-03-04 DIAGNOSIS — M19011 Primary osteoarthritis, right shoulder: Secondary | ICD-10-CM | POA: Diagnosis not present

## 2017-03-05 DIAGNOSIS — R531 Weakness: Secondary | ICD-10-CM | POA: Diagnosis not present

## 2017-03-05 DIAGNOSIS — R2681 Unsteadiness on feet: Secondary | ICD-10-CM | POA: Diagnosis not present

## 2017-03-05 DIAGNOSIS — M19011 Primary osteoarthritis, right shoulder: Secondary | ICD-10-CM | POA: Diagnosis not present

## 2017-03-05 DIAGNOSIS — R278 Other lack of coordination: Secondary | ICD-10-CM | POA: Diagnosis not present

## 2017-03-05 DIAGNOSIS — M6281 Muscle weakness (generalized): Secondary | ICD-10-CM | POA: Diagnosis not present

## 2017-03-05 DIAGNOSIS — M25511 Pain in right shoulder: Secondary | ICD-10-CM | POA: Diagnosis not present

## 2017-03-06 DIAGNOSIS — R531 Weakness: Secondary | ICD-10-CM | POA: Diagnosis not present

## 2017-03-07 DIAGNOSIS — R531 Weakness: Secondary | ICD-10-CM | POA: Diagnosis not present

## 2017-03-10 DIAGNOSIS — M19011 Primary osteoarthritis, right shoulder: Secondary | ICD-10-CM | POA: Diagnosis not present

## 2017-03-10 DIAGNOSIS — R531 Weakness: Secondary | ICD-10-CM | POA: Diagnosis not present

## 2017-03-10 DIAGNOSIS — R278 Other lack of coordination: Secondary | ICD-10-CM | POA: Diagnosis not present

## 2017-03-10 DIAGNOSIS — M6281 Muscle weakness (generalized): Secondary | ICD-10-CM | POA: Diagnosis not present

## 2017-03-10 DIAGNOSIS — R2681 Unsteadiness on feet: Secondary | ICD-10-CM | POA: Diagnosis not present

## 2017-03-10 DIAGNOSIS — M25511 Pain in right shoulder: Secondary | ICD-10-CM | POA: Diagnosis not present

## 2017-03-11 DIAGNOSIS — R278 Other lack of coordination: Secondary | ICD-10-CM | POA: Diagnosis not present

## 2017-03-11 DIAGNOSIS — M19011 Primary osteoarthritis, right shoulder: Secondary | ICD-10-CM | POA: Diagnosis not present

## 2017-03-11 DIAGNOSIS — R531 Weakness: Secondary | ICD-10-CM | POA: Diagnosis not present

## 2017-03-11 DIAGNOSIS — M6281 Muscle weakness (generalized): Secondary | ICD-10-CM | POA: Diagnosis not present

## 2017-03-11 DIAGNOSIS — R2681 Unsteadiness on feet: Secondary | ICD-10-CM | POA: Diagnosis not present

## 2017-03-11 DIAGNOSIS — M25511 Pain in right shoulder: Secondary | ICD-10-CM | POA: Diagnosis not present

## 2017-03-12 DIAGNOSIS — R531 Weakness: Secondary | ICD-10-CM | POA: Diagnosis not present

## 2017-03-13 DIAGNOSIS — R531 Weakness: Secondary | ICD-10-CM | POA: Diagnosis not present

## 2017-03-14 DIAGNOSIS — R531 Weakness: Secondary | ICD-10-CM | POA: Diagnosis not present

## 2017-03-17 DIAGNOSIS — R531 Weakness: Secondary | ICD-10-CM | POA: Diagnosis not present

## 2017-03-18 DIAGNOSIS — R531 Weakness: Secondary | ICD-10-CM | POA: Diagnosis not present

## 2017-03-19 DIAGNOSIS — R531 Weakness: Secondary | ICD-10-CM | POA: Diagnosis not present

## 2017-03-20 DIAGNOSIS — R531 Weakness: Secondary | ICD-10-CM | POA: Diagnosis not present

## 2017-03-21 DIAGNOSIS — M6281 Muscle weakness (generalized): Secondary | ICD-10-CM | POA: Diagnosis not present

## 2017-03-21 DIAGNOSIS — R531 Weakness: Secondary | ICD-10-CM | POA: Diagnosis not present

## 2017-03-21 DIAGNOSIS — M19011 Primary osteoarthritis, right shoulder: Secondary | ICD-10-CM | POA: Diagnosis not present

## 2017-03-21 DIAGNOSIS — M25511 Pain in right shoulder: Secondary | ICD-10-CM | POA: Diagnosis not present

## 2017-03-21 DIAGNOSIS — R278 Other lack of coordination: Secondary | ICD-10-CM | POA: Diagnosis not present

## 2017-03-21 DIAGNOSIS — R2681 Unsteadiness on feet: Secondary | ICD-10-CM | POA: Diagnosis not present

## 2017-03-24 DIAGNOSIS — R2681 Unsteadiness on feet: Secondary | ICD-10-CM | POA: Diagnosis not present

## 2017-03-24 DIAGNOSIS — F0390 Unspecified dementia without behavioral disturbance: Secondary | ICD-10-CM | POA: Diagnosis not present

## 2017-03-24 DIAGNOSIS — M25511 Pain in right shoulder: Secondary | ICD-10-CM | POA: Diagnosis not present

## 2017-03-24 DIAGNOSIS — M6281 Muscle weakness (generalized): Secondary | ICD-10-CM | POA: Diagnosis not present

## 2017-03-24 DIAGNOSIS — I10 Essential (primary) hypertension: Secondary | ICD-10-CM | POA: Diagnosis not present

## 2017-03-24 DIAGNOSIS — R531 Weakness: Secondary | ICD-10-CM | POA: Diagnosis not present

## 2017-03-24 DIAGNOSIS — R278 Other lack of coordination: Secondary | ICD-10-CM | POA: Diagnosis not present

## 2017-03-24 DIAGNOSIS — M19011 Primary osteoarthritis, right shoulder: Secondary | ICD-10-CM | POA: Diagnosis not present

## 2017-03-24 DIAGNOSIS — D649 Anemia, unspecified: Secondary | ICD-10-CM | POA: Diagnosis not present

## 2017-03-24 DIAGNOSIS — I4891 Unspecified atrial fibrillation: Secondary | ICD-10-CM | POA: Diagnosis not present

## 2017-03-25 DIAGNOSIS — M25511 Pain in right shoulder: Secondary | ICD-10-CM | POA: Diagnosis not present

## 2017-03-25 DIAGNOSIS — R278 Other lack of coordination: Secondary | ICD-10-CM | POA: Diagnosis not present

## 2017-03-25 DIAGNOSIS — R531 Weakness: Secondary | ICD-10-CM | POA: Diagnosis not present

## 2017-03-25 DIAGNOSIS — M6281 Muscle weakness (generalized): Secondary | ICD-10-CM | POA: Diagnosis not present

## 2017-03-25 DIAGNOSIS — M79674 Pain in right toe(s): Secondary | ICD-10-CM | POA: Diagnosis not present

## 2017-03-25 DIAGNOSIS — R2681 Unsteadiness on feet: Secondary | ICD-10-CM | POA: Diagnosis not present

## 2017-03-25 DIAGNOSIS — M79675 Pain in left toe(s): Secondary | ICD-10-CM | POA: Diagnosis not present

## 2017-03-25 DIAGNOSIS — M19011 Primary osteoarthritis, right shoulder: Secondary | ICD-10-CM | POA: Diagnosis not present

## 2017-03-25 DIAGNOSIS — B351 Tinea unguium: Secondary | ICD-10-CM | POA: Diagnosis not present

## 2017-03-27 DIAGNOSIS — M19011 Primary osteoarthritis, right shoulder: Secondary | ICD-10-CM | POA: Diagnosis not present

## 2017-03-27 DIAGNOSIS — R2681 Unsteadiness on feet: Secondary | ICD-10-CM | POA: Diagnosis not present

## 2017-03-27 DIAGNOSIS — R531 Weakness: Secondary | ICD-10-CM | POA: Diagnosis not present

## 2017-03-27 DIAGNOSIS — M6281 Muscle weakness (generalized): Secondary | ICD-10-CM | POA: Diagnosis not present

## 2017-03-27 DIAGNOSIS — R278 Other lack of coordination: Secondary | ICD-10-CM | POA: Diagnosis not present

## 2017-03-27 DIAGNOSIS — M25511 Pain in right shoulder: Secondary | ICD-10-CM | POA: Diagnosis not present

## 2017-03-28 DIAGNOSIS — R2681 Unsteadiness on feet: Secondary | ICD-10-CM | POA: Diagnosis not present

## 2017-03-28 DIAGNOSIS — M6281 Muscle weakness (generalized): Secondary | ICD-10-CM | POA: Diagnosis not present

## 2017-03-28 DIAGNOSIS — M25511 Pain in right shoulder: Secondary | ICD-10-CM | POA: Diagnosis not present

## 2017-03-28 DIAGNOSIS — R531 Weakness: Secondary | ICD-10-CM | POA: Diagnosis not present

## 2017-03-28 DIAGNOSIS — M19011 Primary osteoarthritis, right shoulder: Secondary | ICD-10-CM | POA: Diagnosis not present

## 2017-03-28 DIAGNOSIS — R278 Other lack of coordination: Secondary | ICD-10-CM | POA: Diagnosis not present

## 2017-03-31 DIAGNOSIS — R531 Weakness: Secondary | ICD-10-CM | POA: Diagnosis not present

## 2017-03-31 DIAGNOSIS — M6281 Muscle weakness (generalized): Secondary | ICD-10-CM | POA: Diagnosis not present

## 2017-03-31 DIAGNOSIS — M19011 Primary osteoarthritis, right shoulder: Secondary | ICD-10-CM | POA: Diagnosis not present

## 2017-03-31 DIAGNOSIS — R2681 Unsteadiness on feet: Secondary | ICD-10-CM | POA: Diagnosis not present

## 2017-03-31 DIAGNOSIS — R278 Other lack of coordination: Secondary | ICD-10-CM | POA: Diagnosis not present

## 2017-03-31 DIAGNOSIS — M25511 Pain in right shoulder: Secondary | ICD-10-CM | POA: Diagnosis not present

## 2017-04-01 DIAGNOSIS — R278 Other lack of coordination: Secondary | ICD-10-CM | POA: Diagnosis not present

## 2017-04-01 DIAGNOSIS — R531 Weakness: Secondary | ICD-10-CM | POA: Diagnosis not present

## 2017-04-01 DIAGNOSIS — M6281 Muscle weakness (generalized): Secondary | ICD-10-CM | POA: Diagnosis not present

## 2017-04-01 DIAGNOSIS — M19011 Primary osteoarthritis, right shoulder: Secondary | ICD-10-CM | POA: Diagnosis not present

## 2017-04-01 DIAGNOSIS — M25511 Pain in right shoulder: Secondary | ICD-10-CM | POA: Diagnosis not present

## 2017-04-01 DIAGNOSIS — R2681 Unsteadiness on feet: Secondary | ICD-10-CM | POA: Diagnosis not present

## 2017-04-02 DIAGNOSIS — R278 Other lack of coordination: Secondary | ICD-10-CM | POA: Diagnosis not present

## 2017-04-02 DIAGNOSIS — R2681 Unsteadiness on feet: Secondary | ICD-10-CM | POA: Diagnosis not present

## 2017-04-02 DIAGNOSIS — M6281 Muscle weakness (generalized): Secondary | ICD-10-CM | POA: Diagnosis not present

## 2017-04-02 DIAGNOSIS — M25511 Pain in right shoulder: Secondary | ICD-10-CM | POA: Diagnosis not present

## 2017-04-02 DIAGNOSIS — R531 Weakness: Secondary | ICD-10-CM | POA: Diagnosis not present

## 2017-04-02 DIAGNOSIS — M19011 Primary osteoarthritis, right shoulder: Secondary | ICD-10-CM | POA: Diagnosis not present

## 2017-04-03 DIAGNOSIS — R278 Other lack of coordination: Secondary | ICD-10-CM | POA: Diagnosis not present

## 2017-04-03 DIAGNOSIS — R2681 Unsteadiness on feet: Secondary | ICD-10-CM | POA: Diagnosis not present

## 2017-04-03 DIAGNOSIS — M25511 Pain in right shoulder: Secondary | ICD-10-CM | POA: Diagnosis not present

## 2017-04-03 DIAGNOSIS — R531 Weakness: Secondary | ICD-10-CM | POA: Diagnosis not present

## 2017-04-03 DIAGNOSIS — M6281 Muscle weakness (generalized): Secondary | ICD-10-CM | POA: Diagnosis not present

## 2017-04-03 DIAGNOSIS — M19011 Primary osteoarthritis, right shoulder: Secondary | ICD-10-CM | POA: Diagnosis not present

## 2017-04-04 DIAGNOSIS — R531 Weakness: Secondary | ICD-10-CM | POA: Diagnosis not present

## 2017-04-04 DIAGNOSIS — R278 Other lack of coordination: Secondary | ICD-10-CM | POA: Diagnosis not present

## 2017-04-04 DIAGNOSIS — M6281 Muscle weakness (generalized): Secondary | ICD-10-CM | POA: Diagnosis not present

## 2017-04-04 DIAGNOSIS — R2681 Unsteadiness on feet: Secondary | ICD-10-CM | POA: Diagnosis not present

## 2017-04-04 DIAGNOSIS — M19011 Primary osteoarthritis, right shoulder: Secondary | ICD-10-CM | POA: Diagnosis not present

## 2017-04-04 DIAGNOSIS — M25511 Pain in right shoulder: Secondary | ICD-10-CM | POA: Diagnosis not present

## 2017-04-07 DIAGNOSIS — R531 Weakness: Secondary | ICD-10-CM | POA: Diagnosis not present

## 2017-04-07 DIAGNOSIS — R2681 Unsteadiness on feet: Secondary | ICD-10-CM | POA: Diagnosis not present

## 2017-04-07 DIAGNOSIS — M19011 Primary osteoarthritis, right shoulder: Secondary | ICD-10-CM | POA: Diagnosis not present

## 2017-04-07 DIAGNOSIS — M25511 Pain in right shoulder: Secondary | ICD-10-CM | POA: Diagnosis not present

## 2017-04-07 DIAGNOSIS — M6281 Muscle weakness (generalized): Secondary | ICD-10-CM | POA: Diagnosis not present

## 2017-04-07 DIAGNOSIS — R278 Other lack of coordination: Secondary | ICD-10-CM | POA: Diagnosis not present

## 2017-04-08 DIAGNOSIS — R278 Other lack of coordination: Secondary | ICD-10-CM | POA: Diagnosis not present

## 2017-04-08 DIAGNOSIS — M6281 Muscle weakness (generalized): Secondary | ICD-10-CM | POA: Diagnosis not present

## 2017-04-08 DIAGNOSIS — M25511 Pain in right shoulder: Secondary | ICD-10-CM | POA: Diagnosis not present

## 2017-04-08 DIAGNOSIS — M19011 Primary osteoarthritis, right shoulder: Secondary | ICD-10-CM | POA: Diagnosis not present

## 2017-04-08 DIAGNOSIS — R2681 Unsteadiness on feet: Secondary | ICD-10-CM | POA: Diagnosis not present

## 2017-04-08 DIAGNOSIS — R531 Weakness: Secondary | ICD-10-CM | POA: Diagnosis not present

## 2017-04-09 DIAGNOSIS — R2681 Unsteadiness on feet: Secondary | ICD-10-CM | POA: Diagnosis not present

## 2017-04-09 DIAGNOSIS — R531 Weakness: Secondary | ICD-10-CM | POA: Diagnosis not present

## 2017-04-09 DIAGNOSIS — M6281 Muscle weakness (generalized): Secondary | ICD-10-CM | POA: Diagnosis not present

## 2017-04-09 DIAGNOSIS — R278 Other lack of coordination: Secondary | ICD-10-CM | POA: Diagnosis not present

## 2017-04-09 DIAGNOSIS — M25511 Pain in right shoulder: Secondary | ICD-10-CM | POA: Diagnosis not present

## 2017-04-09 DIAGNOSIS — M19011 Primary osteoarthritis, right shoulder: Secondary | ICD-10-CM | POA: Diagnosis not present

## 2017-04-10 DIAGNOSIS — R2681 Unsteadiness on feet: Secondary | ICD-10-CM | POA: Diagnosis not present

## 2017-04-10 DIAGNOSIS — M19011 Primary osteoarthritis, right shoulder: Secondary | ICD-10-CM | POA: Diagnosis not present

## 2017-04-10 DIAGNOSIS — M6281 Muscle weakness (generalized): Secondary | ICD-10-CM | POA: Diagnosis not present

## 2017-04-10 DIAGNOSIS — R531 Weakness: Secondary | ICD-10-CM | POA: Diagnosis not present

## 2017-04-10 DIAGNOSIS — R278 Other lack of coordination: Secondary | ICD-10-CM | POA: Diagnosis not present

## 2017-04-10 DIAGNOSIS — M25511 Pain in right shoulder: Secondary | ICD-10-CM | POA: Diagnosis not present

## 2017-04-11 DIAGNOSIS — M25511 Pain in right shoulder: Secondary | ICD-10-CM | POA: Diagnosis not present

## 2017-04-11 DIAGNOSIS — R2681 Unsteadiness on feet: Secondary | ICD-10-CM | POA: Diagnosis not present

## 2017-04-11 DIAGNOSIS — M6281 Muscle weakness (generalized): Secondary | ICD-10-CM | POA: Diagnosis not present

## 2017-04-11 DIAGNOSIS — R531 Weakness: Secondary | ICD-10-CM | POA: Diagnosis not present

## 2017-04-11 DIAGNOSIS — M19011 Primary osteoarthritis, right shoulder: Secondary | ICD-10-CM | POA: Diagnosis not present

## 2017-04-11 DIAGNOSIS — R278 Other lack of coordination: Secondary | ICD-10-CM | POA: Diagnosis not present

## 2017-04-14 DIAGNOSIS — R2681 Unsteadiness on feet: Secondary | ICD-10-CM | POA: Diagnosis not present

## 2017-04-14 DIAGNOSIS — R531 Weakness: Secondary | ICD-10-CM | POA: Diagnosis not present

## 2017-04-14 DIAGNOSIS — M19011 Primary osteoarthritis, right shoulder: Secondary | ICD-10-CM | POA: Diagnosis not present

## 2017-04-14 DIAGNOSIS — M25511 Pain in right shoulder: Secondary | ICD-10-CM | POA: Diagnosis not present

## 2017-04-14 DIAGNOSIS — R278 Other lack of coordination: Secondary | ICD-10-CM | POA: Diagnosis not present

## 2017-04-14 DIAGNOSIS — M6281 Muscle weakness (generalized): Secondary | ICD-10-CM | POA: Diagnosis not present

## 2017-04-15 DIAGNOSIS — M25511 Pain in right shoulder: Secondary | ICD-10-CM | POA: Diagnosis not present

## 2017-04-15 DIAGNOSIS — R2681 Unsteadiness on feet: Secondary | ICD-10-CM | POA: Diagnosis not present

## 2017-04-15 DIAGNOSIS — M19011 Primary osteoarthritis, right shoulder: Secondary | ICD-10-CM | POA: Diagnosis not present

## 2017-04-15 DIAGNOSIS — R278 Other lack of coordination: Secondary | ICD-10-CM | POA: Diagnosis not present

## 2017-04-15 DIAGNOSIS — R531 Weakness: Secondary | ICD-10-CM | POA: Diagnosis not present

## 2017-04-15 DIAGNOSIS — M6281 Muscle weakness (generalized): Secondary | ICD-10-CM | POA: Diagnosis not present

## 2017-04-16 DIAGNOSIS — R2681 Unsteadiness on feet: Secondary | ICD-10-CM | POA: Diagnosis not present

## 2017-04-16 DIAGNOSIS — M25511 Pain in right shoulder: Secondary | ICD-10-CM | POA: Diagnosis not present

## 2017-04-16 DIAGNOSIS — M19011 Primary osteoarthritis, right shoulder: Secondary | ICD-10-CM | POA: Diagnosis not present

## 2017-04-16 DIAGNOSIS — M6281 Muscle weakness (generalized): Secondary | ICD-10-CM | POA: Diagnosis not present

## 2017-04-16 DIAGNOSIS — R531 Weakness: Secondary | ICD-10-CM | POA: Diagnosis not present

## 2017-04-16 DIAGNOSIS — R278 Other lack of coordination: Secondary | ICD-10-CM | POA: Diagnosis not present

## 2017-04-17 DIAGNOSIS — M19011 Primary osteoarthritis, right shoulder: Secondary | ICD-10-CM | POA: Diagnosis not present

## 2017-04-17 DIAGNOSIS — R278 Other lack of coordination: Secondary | ICD-10-CM | POA: Diagnosis not present

## 2017-04-17 DIAGNOSIS — M25511 Pain in right shoulder: Secondary | ICD-10-CM | POA: Diagnosis not present

## 2017-04-17 DIAGNOSIS — M6281 Muscle weakness (generalized): Secondary | ICD-10-CM | POA: Diagnosis not present

## 2017-04-17 DIAGNOSIS — R2681 Unsteadiness on feet: Secondary | ICD-10-CM | POA: Diagnosis not present

## 2017-04-17 DIAGNOSIS — R531 Weakness: Secondary | ICD-10-CM | POA: Diagnosis not present

## 2017-04-18 DIAGNOSIS — R531 Weakness: Secondary | ICD-10-CM | POA: Diagnosis not present

## 2017-04-18 DIAGNOSIS — R278 Other lack of coordination: Secondary | ICD-10-CM | POA: Diagnosis not present

## 2017-04-18 DIAGNOSIS — M25511 Pain in right shoulder: Secondary | ICD-10-CM | POA: Diagnosis not present

## 2017-04-18 DIAGNOSIS — M19011 Primary osteoarthritis, right shoulder: Secondary | ICD-10-CM | POA: Diagnosis not present

## 2017-04-18 DIAGNOSIS — M6281 Muscle weakness (generalized): Secondary | ICD-10-CM | POA: Diagnosis not present

## 2017-04-18 DIAGNOSIS — R2681 Unsteadiness on feet: Secondary | ICD-10-CM | POA: Diagnosis not present

## 2017-04-21 DIAGNOSIS — R531 Weakness: Secondary | ICD-10-CM | POA: Diagnosis not present

## 2017-04-21 DIAGNOSIS — G309 Alzheimer's disease, unspecified: Secondary | ICD-10-CM | POA: Diagnosis not present

## 2017-04-21 DIAGNOSIS — R2681 Unsteadiness on feet: Secondary | ICD-10-CM | POA: Diagnosis not present

## 2017-04-21 DIAGNOSIS — M6281 Muscle weakness (generalized): Secondary | ICD-10-CM | POA: Diagnosis not present

## 2017-04-21 DIAGNOSIS — R278 Other lack of coordination: Secondary | ICD-10-CM | POA: Diagnosis not present

## 2017-04-21 DIAGNOSIS — I10 Essential (primary) hypertension: Secondary | ICD-10-CM | POA: Diagnosis not present

## 2017-04-21 DIAGNOSIS — I4891 Unspecified atrial fibrillation: Secondary | ICD-10-CM | POA: Diagnosis not present

## 2017-04-21 DIAGNOSIS — M1990 Unspecified osteoarthritis, unspecified site: Secondary | ICD-10-CM | POA: Diagnosis not present

## 2017-04-21 DIAGNOSIS — M19011 Primary osteoarthritis, right shoulder: Secondary | ICD-10-CM | POA: Diagnosis not present

## 2017-04-21 DIAGNOSIS — M25511 Pain in right shoulder: Secondary | ICD-10-CM | POA: Diagnosis not present

## 2017-04-22 DIAGNOSIS — M6281 Muscle weakness (generalized): Secondary | ICD-10-CM | POA: Diagnosis not present

## 2017-04-22 DIAGNOSIS — R2681 Unsteadiness on feet: Secondary | ICD-10-CM | POA: Diagnosis not present

## 2017-04-22 DIAGNOSIS — M19011 Primary osteoarthritis, right shoulder: Secondary | ICD-10-CM | POA: Diagnosis not present

## 2017-04-22 DIAGNOSIS — R278 Other lack of coordination: Secondary | ICD-10-CM | POA: Diagnosis not present

## 2017-04-22 DIAGNOSIS — M25511 Pain in right shoulder: Secondary | ICD-10-CM | POA: Diagnosis not present

## 2017-04-22 DIAGNOSIS — R531 Weakness: Secondary | ICD-10-CM | POA: Diagnosis not present

## 2017-04-23 DIAGNOSIS — M25511 Pain in right shoulder: Secondary | ICD-10-CM | POA: Diagnosis not present

## 2017-04-23 DIAGNOSIS — R278 Other lack of coordination: Secondary | ICD-10-CM | POA: Diagnosis not present

## 2017-04-23 DIAGNOSIS — R531 Weakness: Secondary | ICD-10-CM | POA: Diagnosis not present

## 2017-04-23 DIAGNOSIS — R2681 Unsteadiness on feet: Secondary | ICD-10-CM | POA: Diagnosis not present

## 2017-04-23 DIAGNOSIS — M19011 Primary osteoarthritis, right shoulder: Secondary | ICD-10-CM | POA: Diagnosis not present

## 2017-04-23 DIAGNOSIS — M6281 Muscle weakness (generalized): Secondary | ICD-10-CM | POA: Diagnosis not present

## 2017-04-24 DIAGNOSIS — R531 Weakness: Secondary | ICD-10-CM | POA: Diagnosis not present

## 2017-04-24 DIAGNOSIS — M6281 Muscle weakness (generalized): Secondary | ICD-10-CM | POA: Diagnosis not present

## 2017-04-24 DIAGNOSIS — R278 Other lack of coordination: Secondary | ICD-10-CM | POA: Diagnosis not present

## 2017-04-24 DIAGNOSIS — M25511 Pain in right shoulder: Secondary | ICD-10-CM | POA: Diagnosis not present

## 2017-04-24 DIAGNOSIS — R2681 Unsteadiness on feet: Secondary | ICD-10-CM | POA: Diagnosis not present

## 2017-04-24 DIAGNOSIS — M19011 Primary osteoarthritis, right shoulder: Secondary | ICD-10-CM | POA: Diagnosis not present

## 2017-04-25 DIAGNOSIS — M19011 Primary osteoarthritis, right shoulder: Secondary | ICD-10-CM | POA: Diagnosis not present

## 2017-04-25 DIAGNOSIS — M6281 Muscle weakness (generalized): Secondary | ICD-10-CM | POA: Diagnosis not present

## 2017-04-25 DIAGNOSIS — R2681 Unsteadiness on feet: Secondary | ICD-10-CM | POA: Diagnosis not present

## 2017-04-25 DIAGNOSIS — R531 Weakness: Secondary | ICD-10-CM | POA: Diagnosis not present

## 2017-04-25 DIAGNOSIS — R278 Other lack of coordination: Secondary | ICD-10-CM | POA: Diagnosis not present

## 2017-04-25 DIAGNOSIS — M25511 Pain in right shoulder: Secondary | ICD-10-CM | POA: Diagnosis not present

## 2017-04-28 DIAGNOSIS — R2681 Unsteadiness on feet: Secondary | ICD-10-CM | POA: Diagnosis not present

## 2017-04-28 DIAGNOSIS — G309 Alzheimer's disease, unspecified: Secondary | ICD-10-CM | POA: Diagnosis not present

## 2017-04-28 DIAGNOSIS — M19011 Primary osteoarthritis, right shoulder: Secondary | ICD-10-CM | POA: Diagnosis not present

## 2017-04-28 DIAGNOSIS — F0280 Dementia in other diseases classified elsewhere without behavioral disturbance: Secondary | ICD-10-CM | POA: Diagnosis not present

## 2017-04-28 DIAGNOSIS — R531 Weakness: Secondary | ICD-10-CM | POA: Diagnosis not present

## 2017-04-28 DIAGNOSIS — M25511 Pain in right shoulder: Secondary | ICD-10-CM | POA: Diagnosis not present

## 2017-04-28 DIAGNOSIS — R278 Other lack of coordination: Secondary | ICD-10-CM | POA: Diagnosis not present

## 2017-04-28 DIAGNOSIS — M6281 Muscle weakness (generalized): Secondary | ICD-10-CM | POA: Diagnosis not present

## 2017-04-28 DIAGNOSIS — M25311 Other instability, right shoulder: Secondary | ICD-10-CM | POA: Diagnosis not present

## 2017-04-29 DIAGNOSIS — R2681 Unsteadiness on feet: Secondary | ICD-10-CM | POA: Diagnosis not present

## 2017-04-29 DIAGNOSIS — R278 Other lack of coordination: Secondary | ICD-10-CM | POA: Diagnosis not present

## 2017-04-29 DIAGNOSIS — M6281 Muscle weakness (generalized): Secondary | ICD-10-CM | POA: Diagnosis not present

## 2017-04-29 DIAGNOSIS — M25511 Pain in right shoulder: Secondary | ICD-10-CM | POA: Diagnosis not present

## 2017-04-29 DIAGNOSIS — R531 Weakness: Secondary | ICD-10-CM | POA: Diagnosis not present

## 2017-04-29 DIAGNOSIS — M19011 Primary osteoarthritis, right shoulder: Secondary | ICD-10-CM | POA: Diagnosis not present

## 2017-04-30 DIAGNOSIS — R2681 Unsteadiness on feet: Secondary | ICD-10-CM | POA: Diagnosis not present

## 2017-04-30 DIAGNOSIS — M19011 Primary osteoarthritis, right shoulder: Secondary | ICD-10-CM | POA: Diagnosis not present

## 2017-04-30 DIAGNOSIS — M6281 Muscle weakness (generalized): Secondary | ICD-10-CM | POA: Diagnosis not present

## 2017-04-30 DIAGNOSIS — R531 Weakness: Secondary | ICD-10-CM | POA: Diagnosis not present

## 2017-04-30 DIAGNOSIS — M25511 Pain in right shoulder: Secondary | ICD-10-CM | POA: Diagnosis not present

## 2017-04-30 DIAGNOSIS — R278 Other lack of coordination: Secondary | ICD-10-CM | POA: Diagnosis not present

## 2017-05-01 DIAGNOSIS — M25511 Pain in right shoulder: Secondary | ICD-10-CM | POA: Diagnosis not present

## 2017-05-01 DIAGNOSIS — M19011 Primary osteoarthritis, right shoulder: Secondary | ICD-10-CM | POA: Diagnosis not present

## 2017-05-01 DIAGNOSIS — R531 Weakness: Secondary | ICD-10-CM | POA: Diagnosis not present

## 2017-05-01 DIAGNOSIS — R2681 Unsteadiness on feet: Secondary | ICD-10-CM | POA: Diagnosis not present

## 2017-05-01 DIAGNOSIS — R278 Other lack of coordination: Secondary | ICD-10-CM | POA: Diagnosis not present

## 2017-05-01 DIAGNOSIS — M6281 Muscle weakness (generalized): Secondary | ICD-10-CM | POA: Diagnosis not present

## 2017-05-02 DIAGNOSIS — M25511 Pain in right shoulder: Secondary | ICD-10-CM | POA: Diagnosis not present

## 2017-05-02 DIAGNOSIS — M19011 Primary osteoarthritis, right shoulder: Secondary | ICD-10-CM | POA: Diagnosis not present

## 2017-05-02 DIAGNOSIS — R2681 Unsteadiness on feet: Secondary | ICD-10-CM | POA: Diagnosis not present

## 2017-05-02 DIAGNOSIS — R531 Weakness: Secondary | ICD-10-CM | POA: Diagnosis not present

## 2017-05-02 DIAGNOSIS — M6281 Muscle weakness (generalized): Secondary | ICD-10-CM | POA: Diagnosis not present

## 2017-05-02 DIAGNOSIS — R278 Other lack of coordination: Secondary | ICD-10-CM | POA: Diagnosis not present

## 2017-05-05 DIAGNOSIS — M6281 Muscle weakness (generalized): Secondary | ICD-10-CM | POA: Diagnosis not present

## 2017-05-05 DIAGNOSIS — M25511 Pain in right shoulder: Secondary | ICD-10-CM | POA: Diagnosis not present

## 2017-05-05 DIAGNOSIS — M19011 Primary osteoarthritis, right shoulder: Secondary | ICD-10-CM | POA: Diagnosis not present

## 2017-05-05 DIAGNOSIS — R278 Other lack of coordination: Secondary | ICD-10-CM | POA: Diagnosis not present

## 2017-05-05 DIAGNOSIS — R2681 Unsteadiness on feet: Secondary | ICD-10-CM | POA: Diagnosis not present

## 2017-05-05 DIAGNOSIS — R531 Weakness: Secondary | ICD-10-CM | POA: Diagnosis not present

## 2017-05-06 DIAGNOSIS — M6281 Muscle weakness (generalized): Secondary | ICD-10-CM | POA: Diagnosis not present

## 2017-05-06 DIAGNOSIS — R2681 Unsteadiness on feet: Secondary | ICD-10-CM | POA: Diagnosis not present

## 2017-05-06 DIAGNOSIS — R531 Weakness: Secondary | ICD-10-CM | POA: Diagnosis not present

## 2017-05-06 DIAGNOSIS — M19011 Primary osteoarthritis, right shoulder: Secondary | ICD-10-CM | POA: Diagnosis not present

## 2017-05-06 DIAGNOSIS — R278 Other lack of coordination: Secondary | ICD-10-CM | POA: Diagnosis not present

## 2017-05-06 DIAGNOSIS — M25511 Pain in right shoulder: Secondary | ICD-10-CM | POA: Diagnosis not present

## 2017-05-07 DIAGNOSIS — M25511 Pain in right shoulder: Secondary | ICD-10-CM | POA: Diagnosis not present

## 2017-05-07 DIAGNOSIS — R531 Weakness: Secondary | ICD-10-CM | POA: Diagnosis not present

## 2017-05-07 DIAGNOSIS — M19011 Primary osteoarthritis, right shoulder: Secondary | ICD-10-CM | POA: Diagnosis not present

## 2017-05-07 DIAGNOSIS — R2681 Unsteadiness on feet: Secondary | ICD-10-CM | POA: Diagnosis not present

## 2017-05-07 DIAGNOSIS — R278 Other lack of coordination: Secondary | ICD-10-CM | POA: Diagnosis not present

## 2017-05-07 DIAGNOSIS — M6281 Muscle weakness (generalized): Secondary | ICD-10-CM | POA: Diagnosis not present

## 2017-05-08 DIAGNOSIS — M25511 Pain in right shoulder: Secondary | ICD-10-CM | POA: Diagnosis not present

## 2017-05-08 DIAGNOSIS — R278 Other lack of coordination: Secondary | ICD-10-CM | POA: Diagnosis not present

## 2017-05-08 DIAGNOSIS — R531 Weakness: Secondary | ICD-10-CM | POA: Diagnosis not present

## 2017-05-08 DIAGNOSIS — R2681 Unsteadiness on feet: Secondary | ICD-10-CM | POA: Diagnosis not present

## 2017-05-08 DIAGNOSIS — M19011 Primary osteoarthritis, right shoulder: Secondary | ICD-10-CM | POA: Diagnosis not present

## 2017-05-08 DIAGNOSIS — M6281 Muscle weakness (generalized): Secondary | ICD-10-CM | POA: Diagnosis not present

## 2017-05-09 DIAGNOSIS — R2681 Unsteadiness on feet: Secondary | ICD-10-CM | POA: Diagnosis not present

## 2017-05-09 DIAGNOSIS — M19011 Primary osteoarthritis, right shoulder: Secondary | ICD-10-CM | POA: Diagnosis not present

## 2017-05-09 DIAGNOSIS — R278 Other lack of coordination: Secondary | ICD-10-CM | POA: Diagnosis not present

## 2017-05-09 DIAGNOSIS — M6281 Muscle weakness (generalized): Secondary | ICD-10-CM | POA: Diagnosis not present

## 2017-05-09 DIAGNOSIS — R531 Weakness: Secondary | ICD-10-CM | POA: Diagnosis not present

## 2017-05-09 DIAGNOSIS — M25511 Pain in right shoulder: Secondary | ICD-10-CM | POA: Diagnosis not present

## 2017-05-12 DIAGNOSIS — R531 Weakness: Secondary | ICD-10-CM | POA: Diagnosis not present

## 2017-05-12 DIAGNOSIS — M25511 Pain in right shoulder: Secondary | ICD-10-CM | POA: Diagnosis not present

## 2017-05-12 DIAGNOSIS — R2681 Unsteadiness on feet: Secondary | ICD-10-CM | POA: Diagnosis not present

## 2017-05-12 DIAGNOSIS — M6281 Muscle weakness (generalized): Secondary | ICD-10-CM | POA: Diagnosis not present

## 2017-05-12 DIAGNOSIS — R278 Other lack of coordination: Secondary | ICD-10-CM | POA: Diagnosis not present

## 2017-05-12 DIAGNOSIS — M19011 Primary osteoarthritis, right shoulder: Secondary | ICD-10-CM | POA: Diagnosis not present

## 2017-05-13 DIAGNOSIS — M6281 Muscle weakness (generalized): Secondary | ICD-10-CM | POA: Diagnosis not present

## 2017-05-13 DIAGNOSIS — R278 Other lack of coordination: Secondary | ICD-10-CM | POA: Diagnosis not present

## 2017-05-13 DIAGNOSIS — R2681 Unsteadiness on feet: Secondary | ICD-10-CM | POA: Diagnosis not present

## 2017-05-13 DIAGNOSIS — R531 Weakness: Secondary | ICD-10-CM | POA: Diagnosis not present

## 2017-05-13 DIAGNOSIS — M25511 Pain in right shoulder: Secondary | ICD-10-CM | POA: Diagnosis not present

## 2017-05-13 DIAGNOSIS — M19011 Primary osteoarthritis, right shoulder: Secondary | ICD-10-CM | POA: Diagnosis not present

## 2017-05-14 DIAGNOSIS — M6281 Muscle weakness (generalized): Secondary | ICD-10-CM | POA: Diagnosis not present

## 2017-05-14 DIAGNOSIS — R278 Other lack of coordination: Secondary | ICD-10-CM | POA: Diagnosis not present

## 2017-05-14 DIAGNOSIS — M19011 Primary osteoarthritis, right shoulder: Secondary | ICD-10-CM | POA: Diagnosis not present

## 2017-05-14 DIAGNOSIS — R531 Weakness: Secondary | ICD-10-CM | POA: Diagnosis not present

## 2017-05-14 DIAGNOSIS — M25511 Pain in right shoulder: Secondary | ICD-10-CM | POA: Diagnosis not present

## 2017-05-14 DIAGNOSIS — R2681 Unsteadiness on feet: Secondary | ICD-10-CM | POA: Diagnosis not present

## 2017-05-15 DIAGNOSIS — M25511 Pain in right shoulder: Secondary | ICD-10-CM | POA: Diagnosis not present

## 2017-05-15 DIAGNOSIS — R531 Weakness: Secondary | ICD-10-CM | POA: Diagnosis not present

## 2017-05-15 DIAGNOSIS — R278 Other lack of coordination: Secondary | ICD-10-CM | POA: Diagnosis not present

## 2017-05-15 DIAGNOSIS — R2681 Unsteadiness on feet: Secondary | ICD-10-CM | POA: Diagnosis not present

## 2017-05-15 DIAGNOSIS — M6281 Muscle weakness (generalized): Secondary | ICD-10-CM | POA: Diagnosis not present

## 2017-05-15 DIAGNOSIS — M19011 Primary osteoarthritis, right shoulder: Secondary | ICD-10-CM | POA: Diagnosis not present

## 2017-05-16 DIAGNOSIS — M6281 Muscle weakness (generalized): Secondary | ICD-10-CM | POA: Diagnosis not present

## 2017-05-16 DIAGNOSIS — R531 Weakness: Secondary | ICD-10-CM | POA: Diagnosis not present

## 2017-05-16 DIAGNOSIS — R2681 Unsteadiness on feet: Secondary | ICD-10-CM | POA: Diagnosis not present

## 2017-05-16 DIAGNOSIS — M25511 Pain in right shoulder: Secondary | ICD-10-CM | POA: Diagnosis not present

## 2017-05-16 DIAGNOSIS — M19011 Primary osteoarthritis, right shoulder: Secondary | ICD-10-CM | POA: Diagnosis not present

## 2017-05-16 DIAGNOSIS — R278 Other lack of coordination: Secondary | ICD-10-CM | POA: Diagnosis not present

## 2017-05-19 DIAGNOSIS — M25511 Pain in right shoulder: Secondary | ICD-10-CM | POA: Diagnosis not present

## 2017-05-19 DIAGNOSIS — R531 Weakness: Secondary | ICD-10-CM | POA: Diagnosis not present

## 2017-05-19 DIAGNOSIS — M6281 Muscle weakness (generalized): Secondary | ICD-10-CM | POA: Diagnosis not present

## 2017-05-19 DIAGNOSIS — R2681 Unsteadiness on feet: Secondary | ICD-10-CM | POA: Diagnosis not present

## 2017-05-19 DIAGNOSIS — R278 Other lack of coordination: Secondary | ICD-10-CM | POA: Diagnosis not present

## 2017-05-19 DIAGNOSIS — M19011 Primary osteoarthritis, right shoulder: Secondary | ICD-10-CM | POA: Diagnosis not present

## 2017-05-20 DIAGNOSIS — M19011 Primary osteoarthritis, right shoulder: Secondary | ICD-10-CM | POA: Diagnosis not present

## 2017-05-20 DIAGNOSIS — M6281 Muscle weakness (generalized): Secondary | ICD-10-CM | POA: Diagnosis not present

## 2017-05-20 DIAGNOSIS — R531 Weakness: Secondary | ICD-10-CM | POA: Diagnosis not present

## 2017-05-20 DIAGNOSIS — M25511 Pain in right shoulder: Secondary | ICD-10-CM | POA: Diagnosis not present

## 2017-05-20 DIAGNOSIS — R278 Other lack of coordination: Secondary | ICD-10-CM | POA: Diagnosis not present

## 2017-05-20 DIAGNOSIS — R2681 Unsteadiness on feet: Secondary | ICD-10-CM | POA: Diagnosis not present

## 2017-05-21 DIAGNOSIS — M19011 Primary osteoarthritis, right shoulder: Secondary | ICD-10-CM | POA: Diagnosis not present

## 2017-05-21 DIAGNOSIS — R2681 Unsteadiness on feet: Secondary | ICD-10-CM | POA: Diagnosis not present

## 2017-05-21 DIAGNOSIS — M6281 Muscle weakness (generalized): Secondary | ICD-10-CM | POA: Diagnosis not present

## 2017-05-21 DIAGNOSIS — R531 Weakness: Secondary | ICD-10-CM | POA: Diagnosis not present

## 2017-05-21 DIAGNOSIS — M25511 Pain in right shoulder: Secondary | ICD-10-CM | POA: Diagnosis not present

## 2017-05-21 DIAGNOSIS — R278 Other lack of coordination: Secondary | ICD-10-CM | POA: Diagnosis not present

## 2017-05-22 DIAGNOSIS — R531 Weakness: Secondary | ICD-10-CM | POA: Diagnosis not present

## 2017-06-02 DIAGNOSIS — D649 Anemia, unspecified: Secondary | ICD-10-CM | POA: Diagnosis not present

## 2017-06-02 DIAGNOSIS — I4891 Unspecified atrial fibrillation: Secondary | ICD-10-CM | POA: Diagnosis not present

## 2017-06-02 DIAGNOSIS — I10 Essential (primary) hypertension: Secondary | ICD-10-CM | POA: Diagnosis not present

## 2017-06-02 DIAGNOSIS — K5900 Constipation, unspecified: Secondary | ICD-10-CM | POA: Diagnosis not present

## 2017-06-17 DIAGNOSIS — M79674 Pain in right toe(s): Secondary | ICD-10-CM | POA: Diagnosis not present

## 2017-06-17 DIAGNOSIS — B351 Tinea unguium: Secondary | ICD-10-CM | POA: Diagnosis not present

## 2017-06-17 DIAGNOSIS — M79675 Pain in left toe(s): Secondary | ICD-10-CM | POA: Diagnosis not present

## 2017-07-07 DIAGNOSIS — I10 Essential (primary) hypertension: Secondary | ICD-10-CM | POA: Diagnosis not present

## 2017-07-07 DIAGNOSIS — D649 Anemia, unspecified: Secondary | ICD-10-CM | POA: Diagnosis not present

## 2017-07-07 DIAGNOSIS — F0390 Unspecified dementia without behavioral disturbance: Secondary | ICD-10-CM | POA: Diagnosis not present

## 2017-07-07 DIAGNOSIS — I4891 Unspecified atrial fibrillation: Secondary | ICD-10-CM | POA: Diagnosis not present

## 2017-08-15 DIAGNOSIS — N183 Chronic kidney disease, stage 3 (moderate): Secondary | ICD-10-CM | POA: Diagnosis not present

## 2017-08-15 DIAGNOSIS — F0390 Unspecified dementia without behavioral disturbance: Secondary | ICD-10-CM | POA: Diagnosis not present

## 2017-08-15 DIAGNOSIS — I10 Essential (primary) hypertension: Secondary | ICD-10-CM | POA: Diagnosis not present

## 2017-08-15 DIAGNOSIS — D649 Anemia, unspecified: Secondary | ICD-10-CM | POA: Diagnosis not present

## 2017-09-02 DIAGNOSIS — M79675 Pain in left toe(s): Secondary | ICD-10-CM | POA: Diagnosis not present

## 2017-09-02 DIAGNOSIS — M79674 Pain in right toe(s): Secondary | ICD-10-CM | POA: Diagnosis not present

## 2017-09-02 DIAGNOSIS — B351 Tinea unguium: Secondary | ICD-10-CM | POA: Diagnosis not present

## 2017-11-12 IMAGING — CT CT HEAD W/O CM
3 of 4 series · 16 of 47 positions shown, 19 images · non-contrast
Comparison: 08/18/2012

CLINICAL DATA: Fall today with acute confusion

EXAM:
CT HEAD WITHOUT CONTRAST
TECHNIQUE: Contiguous axial images were obtained from the base of the skull
through the vertex without intravenous contrast.

[Series 4: head wo · axial · 0.44mm/px · z∈[-545,-425]mm · 10 of 29 slices shown, 13 images]
[im 3/29  brain]
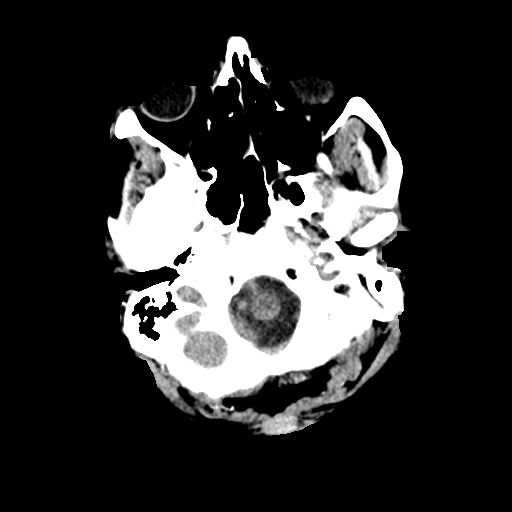
[im 3/29  bone]
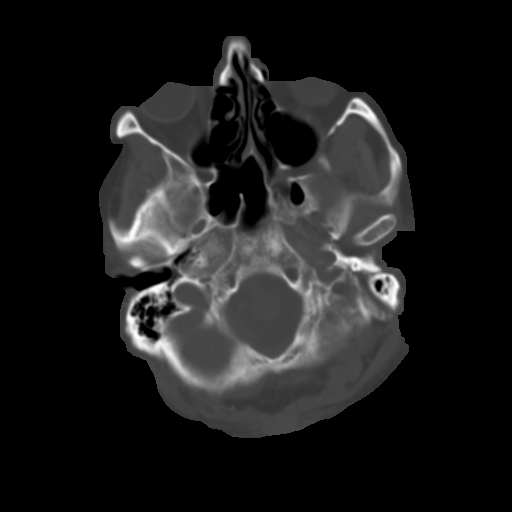
[im 5/29  brain]
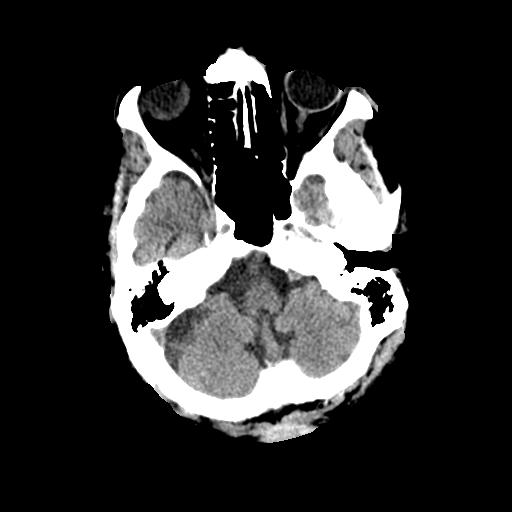
[im 9/29  brain]
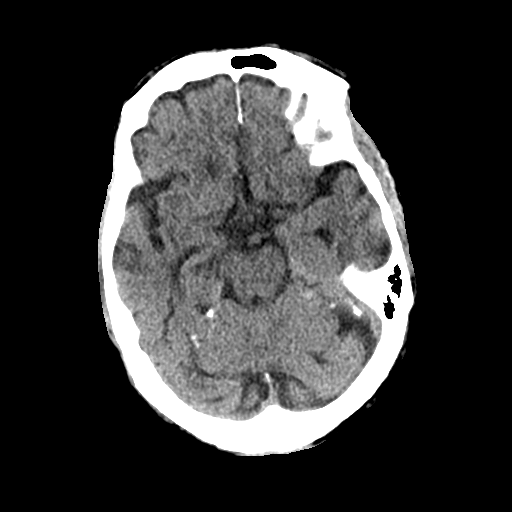
[im 11/29  brain]
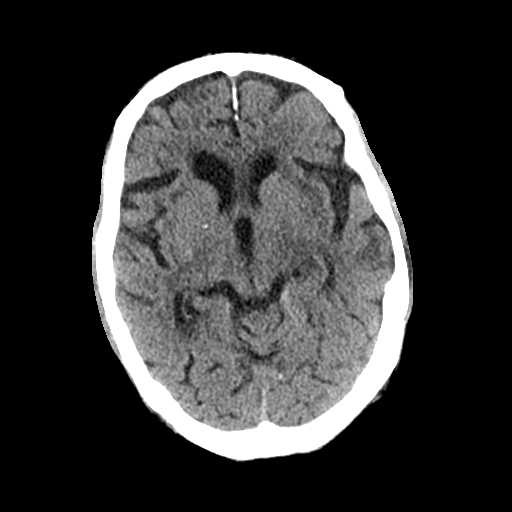
[im 13/29  brain]
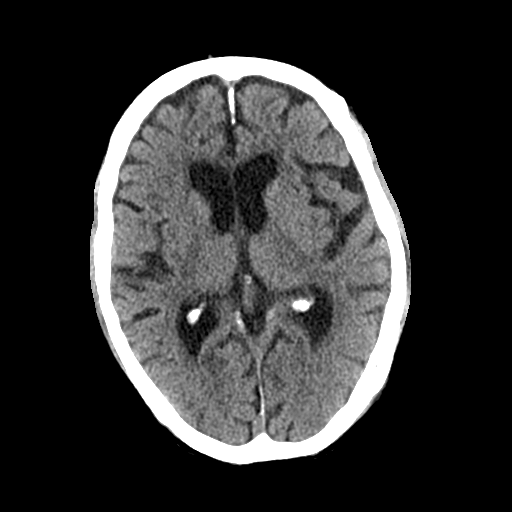
[im 13/29  bone]
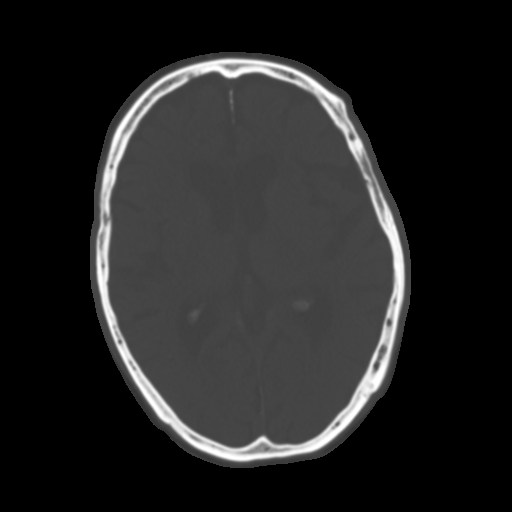
[im 17/29  brain]
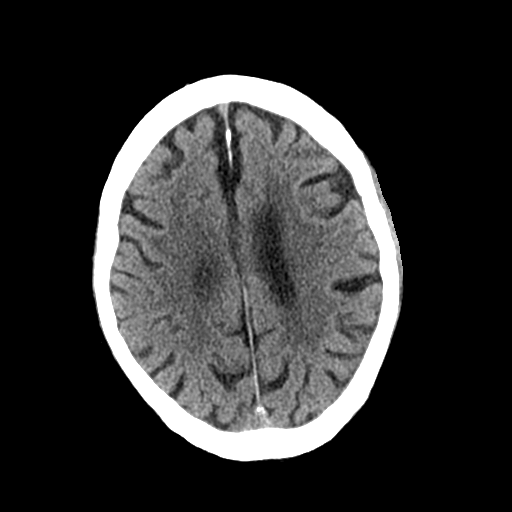
[im 19/29  brain]
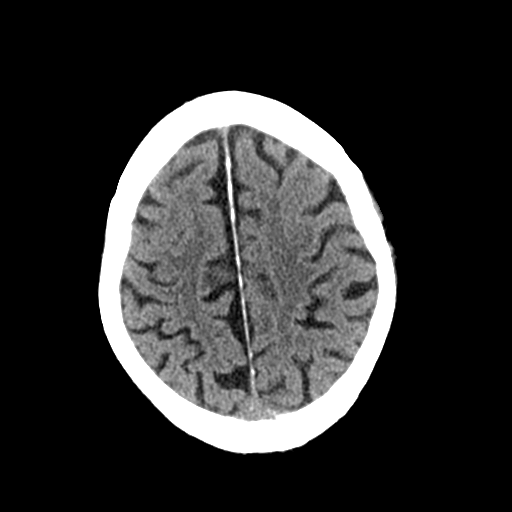
[im 21/29  brain]
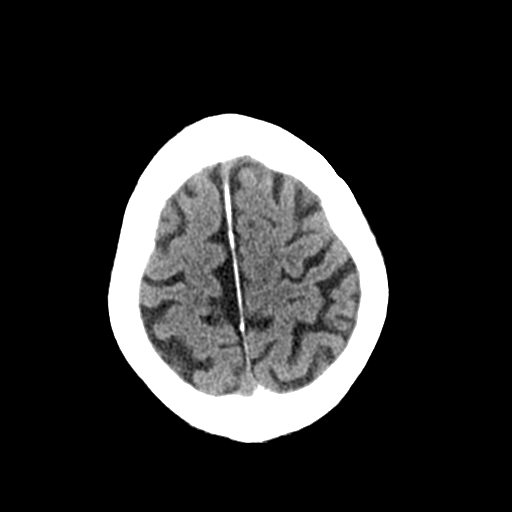
[im 25/29  brain]
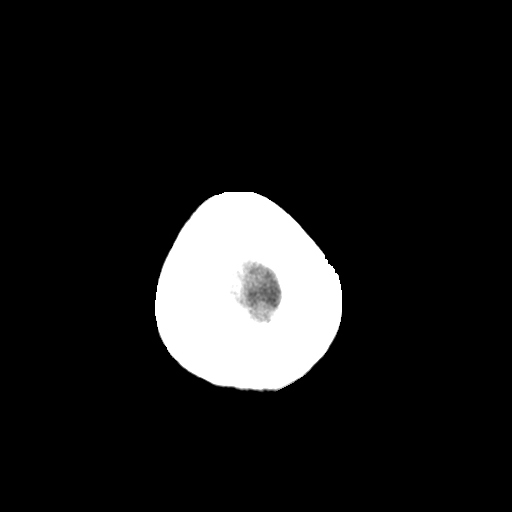
[im 25/29  bone]
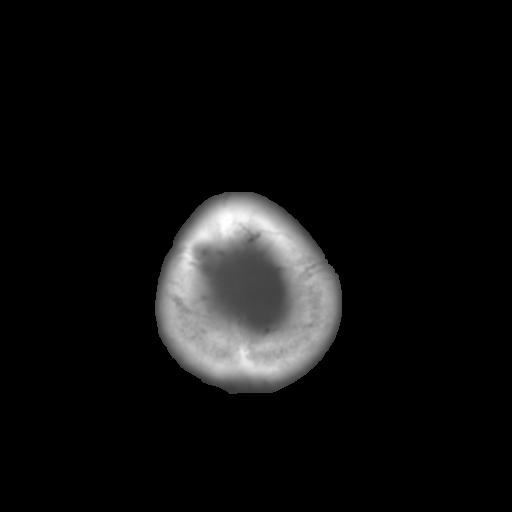
[im 27/29  brain]
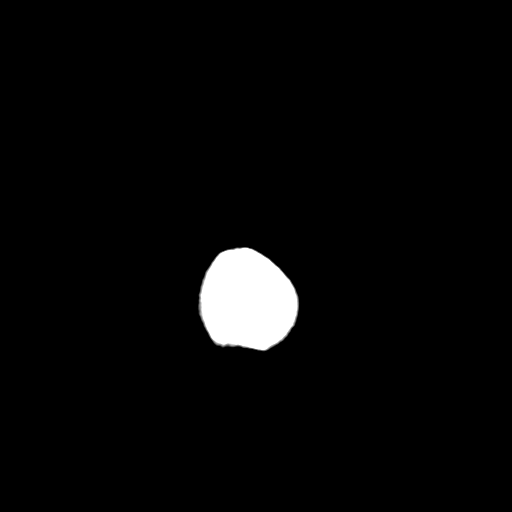

[Series 6: head wo coronal · coronal · 0.30mm/px · 3 of 63 slices shown]
[im 21/63  brain]
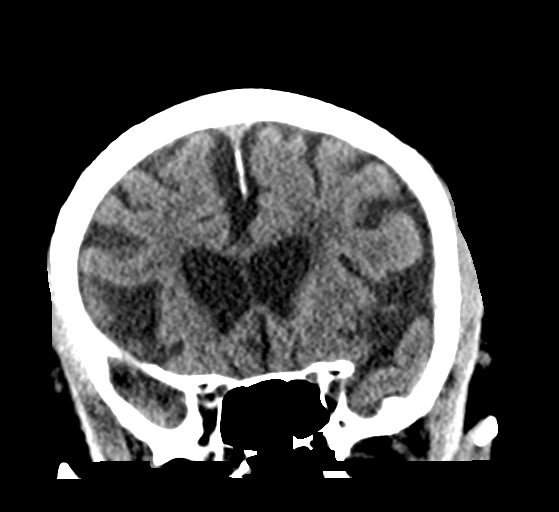
[im 28/63  brain]
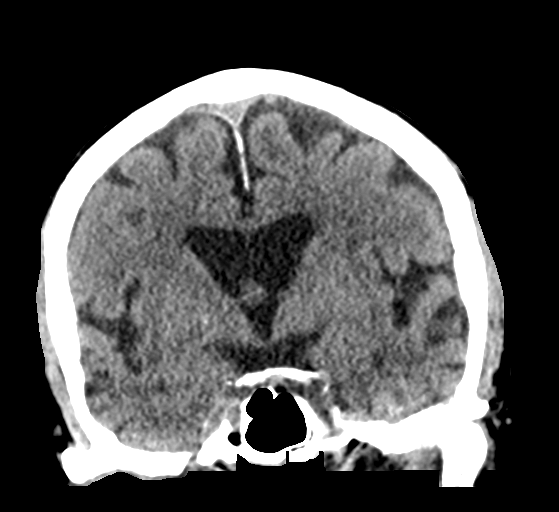
[im 35/63  brain]
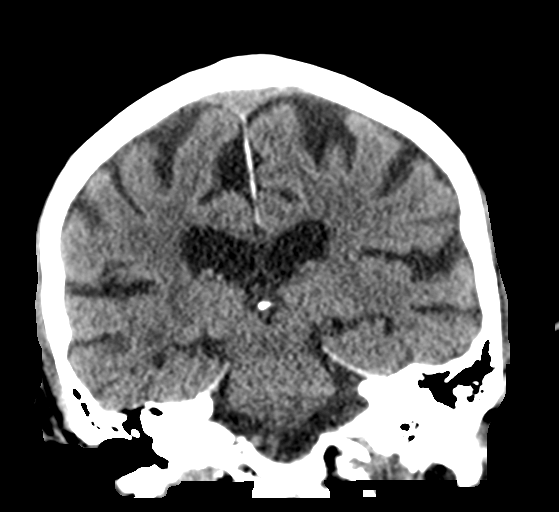

[Series 7: head wo sagittal · sagittal · 0.33mm/px · 3 of 55 slices shown]
[im 19/55  brain]
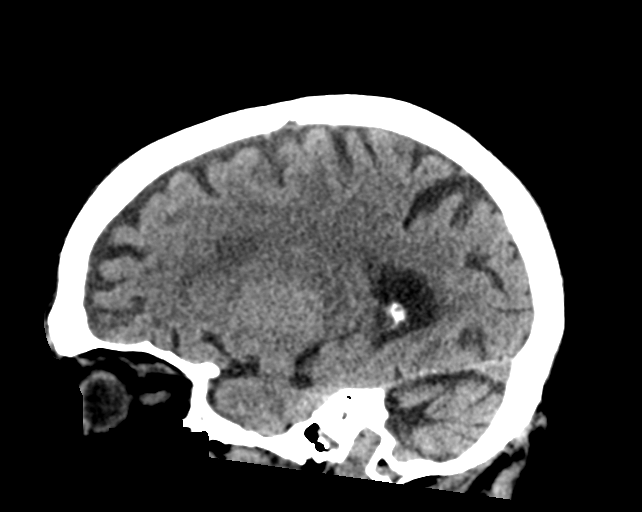
[im 28/55  brain]
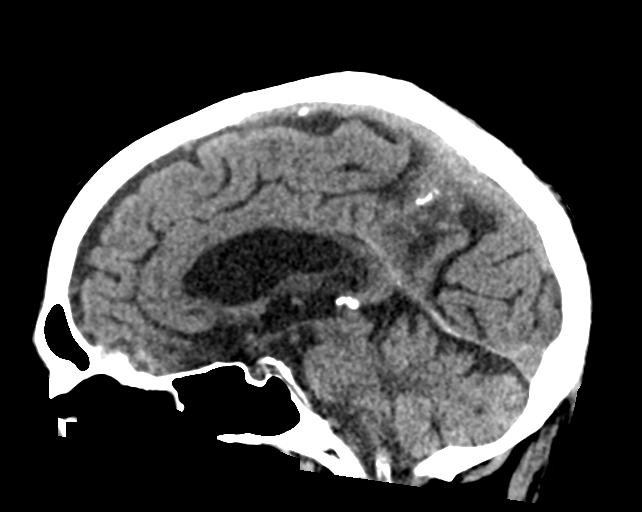
[im 37/55  brain]
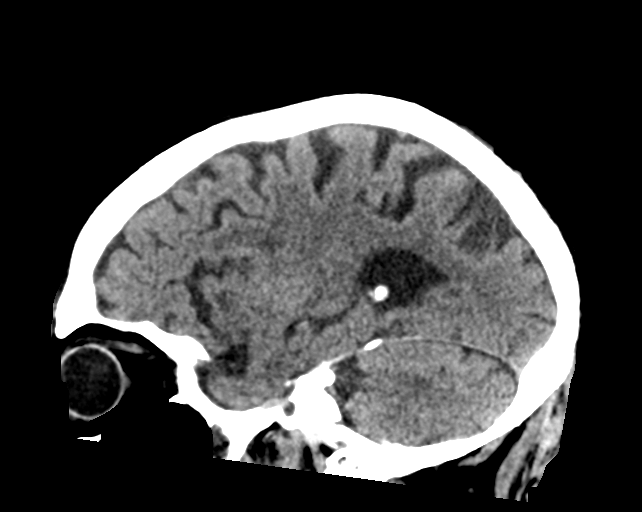

[16 of 47 positions shown; findings below may reference images not displayed]

FINDINGS: Brain: No evidence of acute infarction, hemorrhage, hydrocephalus,
extra-axial collection or mass lesion/mass effect. Chronic atrophic
changes are seen. Areas of deep white matter chronic ischemia are
seen. There also changes consistent with prior cerebellar infarcts
bilaterally.

Vascular: No hyperdense vessel or unexpected calcification.

Skull: Normal. Negative for fracture or focal lesion.

Sinuses/Orbits: No acute finding.

Other: None.
IMPRESSION: Chronic atrophic and ischemic changes. No acute abnormality is seen.

## 2017-11-25 DIAGNOSIS — M79675 Pain in left toe(s): Secondary | ICD-10-CM | POA: Diagnosis not present

## 2017-11-25 DIAGNOSIS — B351 Tinea unguium: Secondary | ICD-10-CM | POA: Diagnosis not present

## 2018-01-23 DIAGNOSIS — I10 Essential (primary) hypertension: Secondary | ICD-10-CM | POA: Diagnosis not present

## 2018-01-23 DIAGNOSIS — F0280 Dementia in other diseases classified elsewhere without behavioral disturbance: Secondary | ICD-10-CM | POA: Diagnosis not present

## 2018-01-23 DIAGNOSIS — G308 Other Alzheimer's disease: Secondary | ICD-10-CM | POA: Diagnosis not present

## 2018-01-23 DIAGNOSIS — D6489 Other specified anemias: Secondary | ICD-10-CM | POA: Diagnosis not present

## 2018-02-18 DIAGNOSIS — M79674 Pain in right toe(s): Secondary | ICD-10-CM | POA: Diagnosis not present

## 2018-02-18 DIAGNOSIS — B351 Tinea unguium: Secondary | ICD-10-CM | POA: Diagnosis not present

## 2018-02-18 DIAGNOSIS — M79675 Pain in left toe(s): Secondary | ICD-10-CM | POA: Diagnosis not present

## 2018-05-05 DIAGNOSIS — M79674 Pain in right toe(s): Secondary | ICD-10-CM | POA: Diagnosis not present

## 2018-05-05 DIAGNOSIS — B351 Tinea unguium: Secondary | ICD-10-CM | POA: Diagnosis not present

## 2018-05-05 DIAGNOSIS — M79675 Pain in left toe(s): Secondary | ICD-10-CM | POA: Diagnosis not present

## 2018-05-20 DIAGNOSIS — Z789 Other specified health status: Secondary | ICD-10-CM | POA: Diagnosis not present

## 2018-05-20 DIAGNOSIS — M25512 Pain in left shoulder: Secondary | ICD-10-CM | POA: Diagnosis not present

## 2018-05-20 DIAGNOSIS — M1388 Other specified arthritis, other site: Secondary | ICD-10-CM | POA: Diagnosis not present

## 2018-05-20 DIAGNOSIS — M25511 Pain in right shoulder: Secondary | ICD-10-CM | POA: Diagnosis not present

## 2018-05-21 DIAGNOSIS — M19012 Primary osteoarthritis, left shoulder: Secondary | ICD-10-CM | POA: Diagnosis not present

## 2018-05-21 DIAGNOSIS — M25511 Pain in right shoulder: Secondary | ICD-10-CM | POA: Diagnosis not present

## 2018-05-21 DIAGNOSIS — R278 Other lack of coordination: Secondary | ICD-10-CM | POA: Diagnosis not present

## 2018-05-21 DIAGNOSIS — R296 Repeated falls: Secondary | ICD-10-CM | POA: Diagnosis not present

## 2018-05-21 DIAGNOSIS — M19011 Primary osteoarthritis, right shoulder: Secondary | ICD-10-CM | POA: Diagnosis not present

## 2018-05-21 DIAGNOSIS — M6281 Muscle weakness (generalized): Secondary | ICD-10-CM | POA: Diagnosis not present

## 2018-05-21 DIAGNOSIS — G309 Alzheimer's disease, unspecified: Secondary | ICD-10-CM | POA: Diagnosis not present

## 2018-05-21 DIAGNOSIS — M25512 Pain in left shoulder: Secondary | ICD-10-CM | POA: Diagnosis not present

## 2018-05-22 DIAGNOSIS — G309 Alzheimer's disease, unspecified: Secondary | ICD-10-CM | POA: Diagnosis not present

## 2018-05-22 DIAGNOSIS — G308 Other Alzheimer's disease: Secondary | ICD-10-CM | POA: Diagnosis not present

## 2018-05-22 DIAGNOSIS — M25511 Pain in right shoulder: Secondary | ICD-10-CM | POA: Diagnosis not present

## 2018-05-22 DIAGNOSIS — M19011 Primary osteoarthritis, right shoulder: Secondary | ICD-10-CM | POA: Diagnosis not present

## 2018-05-22 DIAGNOSIS — M6281 Muscle weakness (generalized): Secondary | ICD-10-CM | POA: Diagnosis not present

## 2018-05-22 DIAGNOSIS — M19012 Primary osteoarthritis, left shoulder: Secondary | ICD-10-CM | POA: Diagnosis not present

## 2018-05-22 DIAGNOSIS — M1388 Other specified arthritis, other site: Secondary | ICD-10-CM | POA: Diagnosis not present

## 2018-05-22 DIAGNOSIS — D696 Thrombocytopenia, unspecified: Secondary | ICD-10-CM | POA: Diagnosis not present

## 2018-05-22 DIAGNOSIS — R296 Repeated falls: Secondary | ICD-10-CM | POA: Diagnosis not present

## 2018-05-22 DIAGNOSIS — R278 Other lack of coordination: Secondary | ICD-10-CM | POA: Diagnosis not present

## 2018-05-22 DIAGNOSIS — M25512 Pain in left shoulder: Secondary | ICD-10-CM | POA: Diagnosis not present

## 2018-05-22 DIAGNOSIS — F0280 Dementia in other diseases classified elsewhere without behavioral disturbance: Secondary | ICD-10-CM | POA: Diagnosis not present

## 2018-05-25 DIAGNOSIS — G309 Alzheimer's disease, unspecified: Secondary | ICD-10-CM | POA: Diagnosis not present

## 2018-05-25 DIAGNOSIS — M19012 Primary osteoarthritis, left shoulder: Secondary | ICD-10-CM | POA: Diagnosis not present

## 2018-05-25 DIAGNOSIS — M19011 Primary osteoarthritis, right shoulder: Secondary | ICD-10-CM | POA: Diagnosis not present

## 2018-05-25 DIAGNOSIS — R278 Other lack of coordination: Secondary | ICD-10-CM | POA: Diagnosis not present

## 2018-05-25 DIAGNOSIS — M25512 Pain in left shoulder: Secondary | ICD-10-CM | POA: Diagnosis not present

## 2018-05-25 DIAGNOSIS — M6281 Muscle weakness (generalized): Secondary | ICD-10-CM | POA: Diagnosis not present

## 2018-05-25 DIAGNOSIS — R296 Repeated falls: Secondary | ICD-10-CM | POA: Diagnosis not present

## 2018-05-25 DIAGNOSIS — M25511 Pain in right shoulder: Secondary | ICD-10-CM | POA: Diagnosis not present

## 2018-05-26 DIAGNOSIS — G309 Alzheimer's disease, unspecified: Secondary | ICD-10-CM | POA: Diagnosis not present

## 2018-05-26 DIAGNOSIS — M6281 Muscle weakness (generalized): Secondary | ICD-10-CM | POA: Diagnosis not present

## 2018-05-26 DIAGNOSIS — R278 Other lack of coordination: Secondary | ICD-10-CM | POA: Diagnosis not present

## 2018-05-26 DIAGNOSIS — R296 Repeated falls: Secondary | ICD-10-CM | POA: Diagnosis not present

## 2018-05-26 DIAGNOSIS — M19012 Primary osteoarthritis, left shoulder: Secondary | ICD-10-CM | POA: Diagnosis not present

## 2018-05-26 DIAGNOSIS — M19011 Primary osteoarthritis, right shoulder: Secondary | ICD-10-CM | POA: Diagnosis not present

## 2018-05-26 DIAGNOSIS — M25511 Pain in right shoulder: Secondary | ICD-10-CM | POA: Diagnosis not present

## 2018-05-26 DIAGNOSIS — M25512 Pain in left shoulder: Secondary | ICD-10-CM | POA: Diagnosis not present

## 2018-05-27 DIAGNOSIS — M19012 Primary osteoarthritis, left shoulder: Secondary | ICD-10-CM | POA: Diagnosis not present

## 2018-05-27 DIAGNOSIS — R296 Repeated falls: Secondary | ICD-10-CM | POA: Diagnosis not present

## 2018-05-27 DIAGNOSIS — M25511 Pain in right shoulder: Secondary | ICD-10-CM | POA: Diagnosis not present

## 2018-05-27 DIAGNOSIS — M6281 Muscle weakness (generalized): Secondary | ICD-10-CM | POA: Diagnosis not present

## 2018-05-27 DIAGNOSIS — R278 Other lack of coordination: Secondary | ICD-10-CM | POA: Diagnosis not present

## 2018-05-27 DIAGNOSIS — G309 Alzheimer's disease, unspecified: Secondary | ICD-10-CM | POA: Diagnosis not present

## 2018-05-27 DIAGNOSIS — M25512 Pain in left shoulder: Secondary | ICD-10-CM | POA: Diagnosis not present

## 2018-05-27 DIAGNOSIS — M19011 Primary osteoarthritis, right shoulder: Secondary | ICD-10-CM | POA: Diagnosis not present

## 2018-05-28 DIAGNOSIS — M6281 Muscle weakness (generalized): Secondary | ICD-10-CM | POA: Diagnosis not present

## 2018-05-28 DIAGNOSIS — M25511 Pain in right shoulder: Secondary | ICD-10-CM | POA: Diagnosis not present

## 2018-05-28 DIAGNOSIS — R278 Other lack of coordination: Secondary | ICD-10-CM | POA: Diagnosis not present

## 2018-05-28 DIAGNOSIS — M25512 Pain in left shoulder: Secondary | ICD-10-CM | POA: Diagnosis not present

## 2018-05-28 DIAGNOSIS — M19011 Primary osteoarthritis, right shoulder: Secondary | ICD-10-CM | POA: Diagnosis not present

## 2018-05-28 DIAGNOSIS — R296 Repeated falls: Secondary | ICD-10-CM | POA: Diagnosis not present

## 2018-05-28 DIAGNOSIS — G309 Alzheimer's disease, unspecified: Secondary | ICD-10-CM | POA: Diagnosis not present

## 2018-05-28 DIAGNOSIS — M19012 Primary osteoarthritis, left shoulder: Secondary | ICD-10-CM | POA: Diagnosis not present

## 2018-05-29 DIAGNOSIS — M25512 Pain in left shoulder: Secondary | ICD-10-CM | POA: Diagnosis not present

## 2018-05-29 DIAGNOSIS — M25511 Pain in right shoulder: Secondary | ICD-10-CM | POA: Diagnosis not present

## 2018-05-29 DIAGNOSIS — G309 Alzheimer's disease, unspecified: Secondary | ICD-10-CM | POA: Diagnosis not present

## 2018-05-29 DIAGNOSIS — M19011 Primary osteoarthritis, right shoulder: Secondary | ICD-10-CM | POA: Diagnosis not present

## 2018-05-29 DIAGNOSIS — M19012 Primary osteoarthritis, left shoulder: Secondary | ICD-10-CM | POA: Diagnosis not present

## 2018-05-29 DIAGNOSIS — R278 Other lack of coordination: Secondary | ICD-10-CM | POA: Diagnosis not present

## 2018-05-29 DIAGNOSIS — R296 Repeated falls: Secondary | ICD-10-CM | POA: Diagnosis not present

## 2018-05-29 DIAGNOSIS — M6281 Muscle weakness (generalized): Secondary | ICD-10-CM | POA: Diagnosis not present

## 2018-06-02 DIAGNOSIS — M19012 Primary osteoarthritis, left shoulder: Secondary | ICD-10-CM | POA: Diagnosis not present

## 2018-06-02 DIAGNOSIS — R278 Other lack of coordination: Secondary | ICD-10-CM | POA: Diagnosis not present

## 2018-06-02 DIAGNOSIS — M6281 Muscle weakness (generalized): Secondary | ICD-10-CM | POA: Diagnosis not present

## 2018-06-02 DIAGNOSIS — M25512 Pain in left shoulder: Secondary | ICD-10-CM | POA: Diagnosis not present

## 2018-06-02 DIAGNOSIS — M19011 Primary osteoarthritis, right shoulder: Secondary | ICD-10-CM | POA: Diagnosis not present

## 2018-06-02 DIAGNOSIS — M25511 Pain in right shoulder: Secondary | ICD-10-CM | POA: Diagnosis not present

## 2018-06-02 DIAGNOSIS — G309 Alzheimer's disease, unspecified: Secondary | ICD-10-CM | POA: Diagnosis not present

## 2018-06-02 DIAGNOSIS — R296 Repeated falls: Secondary | ICD-10-CM | POA: Diagnosis not present

## 2018-06-03 DIAGNOSIS — M9913 Subluxation complex (vertebral) of lumbar region: Secondary | ICD-10-CM | POA: Diagnosis not present

## 2018-06-03 DIAGNOSIS — G309 Alzheimer's disease, unspecified: Secondary | ICD-10-CM | POA: Diagnosis not present

## 2018-06-03 DIAGNOSIS — I10 Essential (primary) hypertension: Secondary | ICD-10-CM | POA: Diagnosis not present

## 2018-06-03 DIAGNOSIS — S32010A Wedge compression fracture of first lumbar vertebra, initial encounter for closed fracture: Secondary | ICD-10-CM | POA: Diagnosis not present

## 2018-06-03 DIAGNOSIS — M19012 Primary osteoarthritis, left shoulder: Secondary | ICD-10-CM | POA: Diagnosis not present

## 2018-06-03 DIAGNOSIS — M19011 Primary osteoarthritis, right shoulder: Secondary | ICD-10-CM | POA: Diagnosis not present

## 2018-06-03 DIAGNOSIS — M6281 Muscle weakness (generalized): Secondary | ICD-10-CM | POA: Diagnosis not present

## 2018-06-03 DIAGNOSIS — R278 Other lack of coordination: Secondary | ICD-10-CM | POA: Diagnosis not present

## 2018-06-03 DIAGNOSIS — M25511 Pain in right shoulder: Secondary | ICD-10-CM | POA: Diagnosis not present

## 2018-06-03 DIAGNOSIS — G8911 Acute pain due to trauma: Secondary | ICD-10-CM | POA: Diagnosis not present

## 2018-06-03 DIAGNOSIS — R296 Repeated falls: Secondary | ICD-10-CM | POA: Diagnosis not present

## 2018-06-03 DIAGNOSIS — M25512 Pain in left shoulder: Secondary | ICD-10-CM | POA: Diagnosis not present

## 2018-06-03 DIAGNOSIS — M1388 Other specified arthritis, other site: Secondary | ICD-10-CM | POA: Diagnosis not present

## 2018-06-03 DIAGNOSIS — M545 Low back pain: Secondary | ICD-10-CM | POA: Diagnosis not present

## 2018-06-04 DIAGNOSIS — R296 Repeated falls: Secondary | ICD-10-CM | POA: Diagnosis not present

## 2018-06-04 DIAGNOSIS — M6281 Muscle weakness (generalized): Secondary | ICD-10-CM | POA: Diagnosis not present

## 2018-06-04 DIAGNOSIS — M25512 Pain in left shoulder: Secondary | ICD-10-CM | POA: Diagnosis not present

## 2018-06-04 DIAGNOSIS — M25511 Pain in right shoulder: Secondary | ICD-10-CM | POA: Diagnosis not present

## 2018-06-04 DIAGNOSIS — R278 Other lack of coordination: Secondary | ICD-10-CM | POA: Diagnosis not present

## 2018-06-04 DIAGNOSIS — M19011 Primary osteoarthritis, right shoulder: Secondary | ICD-10-CM | POA: Diagnosis not present

## 2018-06-04 DIAGNOSIS — M19012 Primary osteoarthritis, left shoulder: Secondary | ICD-10-CM | POA: Diagnosis not present

## 2018-06-04 DIAGNOSIS — G309 Alzheimer's disease, unspecified: Secondary | ICD-10-CM | POA: Diagnosis not present

## 2018-06-05 DIAGNOSIS — M19012 Primary osteoarthritis, left shoulder: Secondary | ICD-10-CM | POA: Diagnosis not present

## 2018-06-05 DIAGNOSIS — M6281 Muscle weakness (generalized): Secondary | ICD-10-CM | POA: Diagnosis not present

## 2018-06-05 DIAGNOSIS — M25511 Pain in right shoulder: Secondary | ICD-10-CM | POA: Diagnosis not present

## 2018-06-05 DIAGNOSIS — G309 Alzheimer's disease, unspecified: Secondary | ICD-10-CM | POA: Diagnosis not present

## 2018-06-05 DIAGNOSIS — R278 Other lack of coordination: Secondary | ICD-10-CM | POA: Diagnosis not present

## 2018-06-05 DIAGNOSIS — M19011 Primary osteoarthritis, right shoulder: Secondary | ICD-10-CM | POA: Diagnosis not present

## 2018-06-05 DIAGNOSIS — R296 Repeated falls: Secondary | ICD-10-CM | POA: Diagnosis not present

## 2018-06-05 DIAGNOSIS — M25512 Pain in left shoulder: Secondary | ICD-10-CM | POA: Diagnosis not present

## 2018-06-08 DIAGNOSIS — M6281 Muscle weakness (generalized): Secondary | ICD-10-CM | POA: Diagnosis not present

## 2018-06-08 DIAGNOSIS — M25512 Pain in left shoulder: Secondary | ICD-10-CM | POA: Diagnosis not present

## 2018-06-08 DIAGNOSIS — M19011 Primary osteoarthritis, right shoulder: Secondary | ICD-10-CM | POA: Diagnosis not present

## 2018-06-08 DIAGNOSIS — M25511 Pain in right shoulder: Secondary | ICD-10-CM | POA: Diagnosis not present

## 2018-06-08 DIAGNOSIS — R278 Other lack of coordination: Secondary | ICD-10-CM | POA: Diagnosis not present

## 2018-06-08 DIAGNOSIS — G309 Alzheimer's disease, unspecified: Secondary | ICD-10-CM | POA: Diagnosis not present

## 2018-06-08 DIAGNOSIS — M19012 Primary osteoarthritis, left shoulder: Secondary | ICD-10-CM | POA: Diagnosis not present

## 2018-06-08 DIAGNOSIS — R296 Repeated falls: Secondary | ICD-10-CM | POA: Diagnosis not present

## 2018-06-09 DIAGNOSIS — M19012 Primary osteoarthritis, left shoulder: Secondary | ICD-10-CM | POA: Diagnosis not present

## 2018-06-09 DIAGNOSIS — R296 Repeated falls: Secondary | ICD-10-CM | POA: Diagnosis not present

## 2018-06-09 DIAGNOSIS — M19011 Primary osteoarthritis, right shoulder: Secondary | ICD-10-CM | POA: Diagnosis not present

## 2018-06-09 DIAGNOSIS — M6281 Muscle weakness (generalized): Secondary | ICD-10-CM | POA: Diagnosis not present

## 2018-06-09 DIAGNOSIS — G309 Alzheimer's disease, unspecified: Secondary | ICD-10-CM | POA: Diagnosis not present

## 2018-06-09 DIAGNOSIS — M25511 Pain in right shoulder: Secondary | ICD-10-CM | POA: Diagnosis not present

## 2018-06-09 DIAGNOSIS — R278 Other lack of coordination: Secondary | ICD-10-CM | POA: Diagnosis not present

## 2018-06-09 DIAGNOSIS — M25512 Pain in left shoulder: Secondary | ICD-10-CM | POA: Diagnosis not present

## 2018-06-10 DIAGNOSIS — G308 Other Alzheimer's disease: Secondary | ICD-10-CM | POA: Diagnosis not present

## 2018-06-10 DIAGNOSIS — F0280 Dementia in other diseases classified elsewhere without behavioral disturbance: Secondary | ICD-10-CM | POA: Diagnosis not present

## 2018-06-10 DIAGNOSIS — I714 Abdominal aortic aneurysm, without rupture: Secondary | ICD-10-CM | POA: Diagnosis not present

## 2018-06-10 DIAGNOSIS — R1084 Generalized abdominal pain: Secondary | ICD-10-CM | POA: Diagnosis not present

## 2018-06-12 DIAGNOSIS — F0280 Dementia in other diseases classified elsewhere without behavioral disturbance: Secondary | ICD-10-CM | POA: Diagnosis not present

## 2018-06-12 DIAGNOSIS — I714 Abdominal aortic aneurysm, without rupture: Secondary | ICD-10-CM | POA: Diagnosis not present

## 2018-06-12 DIAGNOSIS — M1388 Other specified arthritis, other site: Secondary | ICD-10-CM | POA: Diagnosis not present

## 2018-06-12 DIAGNOSIS — I482 Chronic atrial fibrillation: Secondary | ICD-10-CM | POA: Diagnosis not present

## 2018-07-27 DIAGNOSIS — G308 Other Alzheimer's disease: Secondary | ICD-10-CM | POA: Diagnosis not present

## 2018-07-27 DIAGNOSIS — F0280 Dementia in other diseases classified elsewhere without behavioral disturbance: Secondary | ICD-10-CM | POA: Diagnosis not present

## 2018-07-27 DIAGNOSIS — M1388 Other specified arthritis, other site: Secondary | ICD-10-CM | POA: Diagnosis not present

## 2018-07-27 DIAGNOSIS — I0989 Other specified rheumatic heart diseases: Secondary | ICD-10-CM | POA: Diagnosis not present

## 2018-07-29 DIAGNOSIS — M79675 Pain in left toe(s): Secondary | ICD-10-CM | POA: Diagnosis not present

## 2018-07-29 DIAGNOSIS — B351 Tinea unguium: Secondary | ICD-10-CM | POA: Diagnosis not present

## 2018-07-29 DIAGNOSIS — M79674 Pain in right toe(s): Secondary | ICD-10-CM | POA: Diagnosis not present

## 2018-08-26 DIAGNOSIS — H0101B Ulcerative blepharitis left eye, upper and lower eyelids: Secondary | ICD-10-CM | POA: Diagnosis not present

## 2018-08-26 DIAGNOSIS — H04123 Dry eye syndrome of bilateral lacrimal glands: Secondary | ICD-10-CM | POA: Diagnosis not present

## 2018-08-26 DIAGNOSIS — H26493 Other secondary cataract, bilateral: Secondary | ICD-10-CM | POA: Diagnosis not present

## 2018-08-26 DIAGNOSIS — H0101A Ulcerative blepharitis right eye, upper and lower eyelids: Secondary | ICD-10-CM | POA: Diagnosis not present

## 2018-09-23 DIAGNOSIS — I10 Essential (primary) hypertension: Secondary | ICD-10-CM | POA: Diagnosis not present

## 2018-09-23 DIAGNOSIS — I480 Paroxysmal atrial fibrillation: Secondary | ICD-10-CM | POA: Diagnosis not present

## 2018-09-23 DIAGNOSIS — N183 Chronic kidney disease, stage 3 (moderate): Secondary | ICD-10-CM | POA: Diagnosis not present

## 2018-09-23 DIAGNOSIS — D6949 Other primary thrombocytopenia: Secondary | ICD-10-CM | POA: Diagnosis not present

## 2018-09-24 DIAGNOSIS — N185 Chronic kidney disease, stage 5: Secondary | ICD-10-CM | POA: Diagnosis not present

## 2018-09-24 DIAGNOSIS — D649 Anemia, unspecified: Secondary | ICD-10-CM | POA: Diagnosis not present

## 2018-09-25 DIAGNOSIS — D6489 Other specified anemias: Secondary | ICD-10-CM | POA: Diagnosis not present

## 2018-09-25 DIAGNOSIS — I099 Rheumatic heart disease, unspecified: Secondary | ICD-10-CM | POA: Diagnosis not present

## 2018-09-25 DIAGNOSIS — F0280 Dementia in other diseases classified elsewhere without behavioral disturbance: Secondary | ICD-10-CM | POA: Diagnosis not present

## 2018-09-25 DIAGNOSIS — G308 Other Alzheimer's disease: Secondary | ICD-10-CM | POA: Diagnosis not present

## 2018-10-02 DIAGNOSIS — R112 Nausea with vomiting, unspecified: Secondary | ICD-10-CM | POA: Diagnosis not present

## 2018-10-02 DIAGNOSIS — I10 Essential (primary) hypertension: Secondary | ICD-10-CM | POA: Diagnosis not present

## 2018-10-02 DIAGNOSIS — M1388 Other specified arthritis, other site: Secondary | ICD-10-CM | POA: Diagnosis not present

## 2018-10-28 DIAGNOSIS — M79675 Pain in left toe(s): Secondary | ICD-10-CM | POA: Diagnosis not present

## 2018-10-28 DIAGNOSIS — M79674 Pain in right toe(s): Secondary | ICD-10-CM | POA: Diagnosis not present

## 2018-10-28 DIAGNOSIS — B351 Tinea unguium: Secondary | ICD-10-CM | POA: Diagnosis not present

## 2018-11-10 DIAGNOSIS — G309 Alzheimer's disease, unspecified: Secondary | ICD-10-CM | POA: Diagnosis not present

## 2018-11-10 DIAGNOSIS — R2681 Unsteadiness on feet: Secondary | ICD-10-CM | POA: Diagnosis not present

## 2018-11-10 DIAGNOSIS — R2689 Other abnormalities of gait and mobility: Secondary | ICD-10-CM | POA: Diagnosis not present

## 2018-11-10 DIAGNOSIS — M6281 Muscle weakness (generalized): Secondary | ICD-10-CM | POA: Diagnosis not present

## 2018-11-11 DIAGNOSIS — R2689 Other abnormalities of gait and mobility: Secondary | ICD-10-CM | POA: Diagnosis not present

## 2018-11-11 DIAGNOSIS — M6281 Muscle weakness (generalized): Secondary | ICD-10-CM | POA: Diagnosis not present

## 2018-11-11 DIAGNOSIS — G309 Alzheimer's disease, unspecified: Secondary | ICD-10-CM | POA: Diagnosis not present

## 2018-11-11 DIAGNOSIS — R2681 Unsteadiness on feet: Secondary | ICD-10-CM | POA: Diagnosis not present

## 2018-11-12 DIAGNOSIS — R2681 Unsteadiness on feet: Secondary | ICD-10-CM | POA: Diagnosis not present

## 2018-11-12 DIAGNOSIS — G309 Alzheimer's disease, unspecified: Secondary | ICD-10-CM | POA: Diagnosis not present

## 2018-11-12 DIAGNOSIS — R2689 Other abnormalities of gait and mobility: Secondary | ICD-10-CM | POA: Diagnosis not present

## 2018-11-12 DIAGNOSIS — M6281 Muscle weakness (generalized): Secondary | ICD-10-CM | POA: Diagnosis not present

## 2018-11-13 DIAGNOSIS — G309 Alzheimer's disease, unspecified: Secondary | ICD-10-CM | POA: Diagnosis not present

## 2018-11-13 DIAGNOSIS — R2689 Other abnormalities of gait and mobility: Secondary | ICD-10-CM | POA: Diagnosis not present

## 2018-11-13 DIAGNOSIS — R2681 Unsteadiness on feet: Secondary | ICD-10-CM | POA: Diagnosis not present

## 2018-11-13 DIAGNOSIS — M6281 Muscle weakness (generalized): Secondary | ICD-10-CM | POA: Diagnosis not present

## 2018-11-14 DIAGNOSIS — R2689 Other abnormalities of gait and mobility: Secondary | ICD-10-CM | POA: Diagnosis not present

## 2018-11-14 DIAGNOSIS — R2681 Unsteadiness on feet: Secondary | ICD-10-CM | POA: Diagnosis not present

## 2018-11-14 DIAGNOSIS — M6281 Muscle weakness (generalized): Secondary | ICD-10-CM | POA: Diagnosis not present

## 2018-11-14 DIAGNOSIS — G309 Alzheimer's disease, unspecified: Secondary | ICD-10-CM | POA: Diagnosis not present

## 2018-11-16 DIAGNOSIS — G309 Alzheimer's disease, unspecified: Secondary | ICD-10-CM | POA: Diagnosis not present

## 2018-11-16 DIAGNOSIS — R2689 Other abnormalities of gait and mobility: Secondary | ICD-10-CM | POA: Diagnosis not present

## 2018-11-16 DIAGNOSIS — R2681 Unsteadiness on feet: Secondary | ICD-10-CM | POA: Diagnosis not present

## 2018-11-16 DIAGNOSIS — M6281 Muscle weakness (generalized): Secondary | ICD-10-CM | POA: Diagnosis not present

## 2018-11-18 DIAGNOSIS — M6281 Muscle weakness (generalized): Secondary | ICD-10-CM | POA: Diagnosis not present

## 2018-11-18 DIAGNOSIS — R2681 Unsteadiness on feet: Secondary | ICD-10-CM | POA: Diagnosis not present

## 2018-11-18 DIAGNOSIS — R2689 Other abnormalities of gait and mobility: Secondary | ICD-10-CM | POA: Diagnosis not present

## 2018-11-18 DIAGNOSIS — G309 Alzheimer's disease, unspecified: Secondary | ICD-10-CM | POA: Diagnosis not present

## 2018-11-19 DIAGNOSIS — G309 Alzheimer's disease, unspecified: Secondary | ICD-10-CM | POA: Diagnosis not present

## 2018-11-19 DIAGNOSIS — M6281 Muscle weakness (generalized): Secondary | ICD-10-CM | POA: Diagnosis not present

## 2018-11-19 DIAGNOSIS — R2689 Other abnormalities of gait and mobility: Secondary | ICD-10-CM | POA: Diagnosis not present

## 2018-11-19 DIAGNOSIS — R2681 Unsteadiness on feet: Secondary | ICD-10-CM | POA: Diagnosis not present

## 2018-11-20 DIAGNOSIS — M6281 Muscle weakness (generalized): Secondary | ICD-10-CM | POA: Diagnosis not present

## 2018-11-20 DIAGNOSIS — R2681 Unsteadiness on feet: Secondary | ICD-10-CM | POA: Diagnosis not present

## 2018-11-20 DIAGNOSIS — R2689 Other abnormalities of gait and mobility: Secondary | ICD-10-CM | POA: Diagnosis not present

## 2018-11-20 DIAGNOSIS — G309 Alzheimer's disease, unspecified: Secondary | ICD-10-CM | POA: Diagnosis not present

## 2018-11-23 DIAGNOSIS — R2681 Unsteadiness on feet: Secondary | ICD-10-CM | POA: Diagnosis not present

## 2018-11-23 DIAGNOSIS — M6281 Muscle weakness (generalized): Secondary | ICD-10-CM | POA: Diagnosis not present

## 2018-11-23 DIAGNOSIS — M1388 Other specified arthritis, other site: Secondary | ICD-10-CM | POA: Diagnosis not present

## 2018-11-23 DIAGNOSIS — R2689 Other abnormalities of gait and mobility: Secondary | ICD-10-CM | POA: Diagnosis not present

## 2018-11-23 DIAGNOSIS — G309 Alzheimer's disease, unspecified: Secondary | ICD-10-CM | POA: Diagnosis not present

## 2018-11-23 DIAGNOSIS — F0280 Dementia in other diseases classified elsewhere without behavioral disturbance: Secondary | ICD-10-CM | POA: Diagnosis not present

## 2018-11-23 DIAGNOSIS — I0989 Other specified rheumatic heart diseases: Secondary | ICD-10-CM | POA: Diagnosis not present

## 2018-11-23 DIAGNOSIS — G308 Other Alzheimer's disease: Secondary | ICD-10-CM | POA: Diagnosis not present

## 2018-11-24 DIAGNOSIS — R2689 Other abnormalities of gait and mobility: Secondary | ICD-10-CM | POA: Diagnosis not present

## 2018-11-24 DIAGNOSIS — R2681 Unsteadiness on feet: Secondary | ICD-10-CM | POA: Diagnosis not present

## 2018-11-24 DIAGNOSIS — G309 Alzheimer's disease, unspecified: Secondary | ICD-10-CM | POA: Diagnosis not present

## 2018-11-24 DIAGNOSIS — M6281 Muscle weakness (generalized): Secondary | ICD-10-CM | POA: Diagnosis not present

## 2018-11-25 DIAGNOSIS — R2689 Other abnormalities of gait and mobility: Secondary | ICD-10-CM | POA: Diagnosis not present

## 2018-11-25 DIAGNOSIS — G309 Alzheimer's disease, unspecified: Secondary | ICD-10-CM | POA: Diagnosis not present

## 2018-11-25 DIAGNOSIS — R2681 Unsteadiness on feet: Secondary | ICD-10-CM | POA: Diagnosis not present

## 2018-11-25 DIAGNOSIS — M6281 Muscle weakness (generalized): Secondary | ICD-10-CM | POA: Diagnosis not present

## 2018-11-27 DIAGNOSIS — D6489 Other specified anemias: Secondary | ICD-10-CM | POA: Diagnosis not present

## 2018-11-27 DIAGNOSIS — G308 Other Alzheimer's disease: Secondary | ICD-10-CM | POA: Diagnosis not present

## 2018-11-27 DIAGNOSIS — G309 Alzheimer's disease, unspecified: Secondary | ICD-10-CM | POA: Diagnosis not present

## 2018-11-27 DIAGNOSIS — F0280 Dementia in other diseases classified elsewhere without behavioral disturbance: Secondary | ICD-10-CM | POA: Diagnosis not present

## 2018-11-27 DIAGNOSIS — R2689 Other abnormalities of gait and mobility: Secondary | ICD-10-CM | POA: Diagnosis not present

## 2018-11-27 DIAGNOSIS — R2681 Unsteadiness on feet: Secondary | ICD-10-CM | POA: Diagnosis not present

## 2018-11-27 DIAGNOSIS — M6281 Muscle weakness (generalized): Secondary | ICD-10-CM | POA: Diagnosis not present

## 2018-11-27 DIAGNOSIS — M1388 Other specified arthritis, other site: Secondary | ICD-10-CM | POA: Diagnosis not present

## 2018-11-28 DIAGNOSIS — G309 Alzheimer's disease, unspecified: Secondary | ICD-10-CM | POA: Diagnosis not present

## 2018-11-28 DIAGNOSIS — R2689 Other abnormalities of gait and mobility: Secondary | ICD-10-CM | POA: Diagnosis not present

## 2018-11-28 DIAGNOSIS — M6281 Muscle weakness (generalized): Secondary | ICD-10-CM | POA: Diagnosis not present

## 2018-11-28 DIAGNOSIS — R2681 Unsteadiness on feet: Secondary | ICD-10-CM | POA: Diagnosis not present

## 2018-11-30 DIAGNOSIS — G309 Alzheimer's disease, unspecified: Secondary | ICD-10-CM | POA: Diagnosis not present

## 2018-11-30 DIAGNOSIS — R2681 Unsteadiness on feet: Secondary | ICD-10-CM | POA: Diagnosis not present

## 2018-11-30 DIAGNOSIS — M6281 Muscle weakness (generalized): Secondary | ICD-10-CM | POA: Diagnosis not present

## 2018-11-30 DIAGNOSIS — R2689 Other abnormalities of gait and mobility: Secondary | ICD-10-CM | POA: Diagnosis not present

## 2018-12-01 DIAGNOSIS — R2681 Unsteadiness on feet: Secondary | ICD-10-CM | POA: Diagnosis not present

## 2018-12-01 DIAGNOSIS — G309 Alzheimer's disease, unspecified: Secondary | ICD-10-CM | POA: Diagnosis not present

## 2018-12-01 DIAGNOSIS — R2689 Other abnormalities of gait and mobility: Secondary | ICD-10-CM | POA: Diagnosis not present

## 2018-12-01 DIAGNOSIS — M6281 Muscle weakness (generalized): Secondary | ICD-10-CM | POA: Diagnosis not present

## 2018-12-02 DIAGNOSIS — G309 Alzheimer's disease, unspecified: Secondary | ICD-10-CM | POA: Diagnosis not present

## 2018-12-02 DIAGNOSIS — R2689 Other abnormalities of gait and mobility: Secondary | ICD-10-CM | POA: Diagnosis not present

## 2018-12-02 DIAGNOSIS — R2681 Unsteadiness on feet: Secondary | ICD-10-CM | POA: Diagnosis not present

## 2018-12-02 DIAGNOSIS — M6281 Muscle weakness (generalized): Secondary | ICD-10-CM | POA: Diagnosis not present

## 2018-12-03 DIAGNOSIS — R2681 Unsteadiness on feet: Secondary | ICD-10-CM | POA: Diagnosis not present

## 2018-12-03 DIAGNOSIS — G309 Alzheimer's disease, unspecified: Secondary | ICD-10-CM | POA: Diagnosis not present

## 2018-12-03 DIAGNOSIS — M6281 Muscle weakness (generalized): Secondary | ICD-10-CM | POA: Diagnosis not present

## 2018-12-03 DIAGNOSIS — R2689 Other abnormalities of gait and mobility: Secondary | ICD-10-CM | POA: Diagnosis not present

## 2018-12-04 DIAGNOSIS — M6281 Muscle weakness (generalized): Secondary | ICD-10-CM | POA: Diagnosis not present

## 2018-12-04 DIAGNOSIS — R2681 Unsteadiness on feet: Secondary | ICD-10-CM | POA: Diagnosis not present

## 2018-12-04 DIAGNOSIS — G309 Alzheimer's disease, unspecified: Secondary | ICD-10-CM | POA: Diagnosis not present

## 2018-12-04 DIAGNOSIS — R2689 Other abnormalities of gait and mobility: Secondary | ICD-10-CM | POA: Diagnosis not present

## 2018-12-24 DIAGNOSIS — M1388 Other specified arthritis, other site: Secondary | ICD-10-CM | POA: Diagnosis not present

## 2018-12-24 DIAGNOSIS — F0280 Dementia in other diseases classified elsewhere without behavioral disturbance: Secondary | ICD-10-CM | POA: Diagnosis not present

## 2018-12-24 DIAGNOSIS — G308 Other Alzheimer's disease: Secondary | ICD-10-CM | POA: Diagnosis not present

## 2018-12-24 DIAGNOSIS — D6489 Other specified anemias: Secondary | ICD-10-CM | POA: Diagnosis not present

## 2018-12-30 DIAGNOSIS — F0280 Dementia in other diseases classified elsewhere without behavioral disturbance: Secondary | ICD-10-CM | POA: Diagnosis not present

## 2018-12-30 DIAGNOSIS — R05 Cough: Secondary | ICD-10-CM | POA: Diagnosis not present

## 2018-12-30 DIAGNOSIS — G308 Other Alzheimer's disease: Secondary | ICD-10-CM | POA: Diagnosis not present

## 2019-01-01 DIAGNOSIS — F0280 Dementia in other diseases classified elsewhere without behavioral disturbance: Secondary | ICD-10-CM | POA: Diagnosis not present

## 2019-01-01 DIAGNOSIS — I480 Paroxysmal atrial fibrillation: Secondary | ICD-10-CM | POA: Diagnosis not present

## 2019-01-01 DIAGNOSIS — M1388 Other specified arthritis, other site: Secondary | ICD-10-CM | POA: Diagnosis not present

## 2019-01-01 DIAGNOSIS — G308 Other Alzheimer's disease: Secondary | ICD-10-CM | POA: Diagnosis not present

## 2019-01-04 DIAGNOSIS — I10 Essential (primary) hypertension: Secondary | ICD-10-CM | POA: Diagnosis not present

## 2019-01-04 DIAGNOSIS — N390 Urinary tract infection, site not specified: Secondary | ICD-10-CM | POA: Diagnosis not present

## 2019-01-15 DIAGNOSIS — G309 Alzheimer's disease, unspecified: Secondary | ICD-10-CM | POA: Diagnosis not present

## 2019-01-15 DIAGNOSIS — M6281 Muscle weakness (generalized): Secondary | ICD-10-CM | POA: Diagnosis not present

## 2019-01-15 DIAGNOSIS — M25511 Pain in right shoulder: Secondary | ICD-10-CM | POA: Diagnosis not present

## 2019-01-16 DIAGNOSIS — M6281 Muscle weakness (generalized): Secondary | ICD-10-CM | POA: Diagnosis not present

## 2019-01-16 DIAGNOSIS — G309 Alzheimer's disease, unspecified: Secondary | ICD-10-CM | POA: Diagnosis not present

## 2019-01-16 DIAGNOSIS — M25511 Pain in right shoulder: Secondary | ICD-10-CM | POA: Diagnosis not present

## 2019-01-17 DIAGNOSIS — M25511 Pain in right shoulder: Secondary | ICD-10-CM | POA: Diagnosis not present

## 2019-01-17 DIAGNOSIS — M6281 Muscle weakness (generalized): Secondary | ICD-10-CM | POA: Diagnosis not present

## 2019-01-17 DIAGNOSIS — G309 Alzheimer's disease, unspecified: Secondary | ICD-10-CM | POA: Diagnosis not present

## 2019-01-19 DIAGNOSIS — G309 Alzheimer's disease, unspecified: Secondary | ICD-10-CM | POA: Diagnosis not present

## 2019-01-19 DIAGNOSIS — M25511 Pain in right shoulder: Secondary | ICD-10-CM | POA: Diagnosis not present

## 2019-01-19 DIAGNOSIS — M6281 Muscle weakness (generalized): Secondary | ICD-10-CM | POA: Diagnosis not present

## 2019-01-20 DIAGNOSIS — M25511 Pain in right shoulder: Secondary | ICD-10-CM | POA: Diagnosis not present

## 2019-01-20 DIAGNOSIS — M6281 Muscle weakness (generalized): Secondary | ICD-10-CM | POA: Diagnosis not present

## 2019-01-20 DIAGNOSIS — G309 Alzheimer's disease, unspecified: Secondary | ICD-10-CM | POA: Diagnosis not present

## 2019-01-22 DIAGNOSIS — M25511 Pain in right shoulder: Secondary | ICD-10-CM | POA: Diagnosis not present

## 2019-01-22 DIAGNOSIS — I714 Abdominal aortic aneurysm, without rupture: Secondary | ICD-10-CM | POA: Diagnosis not present

## 2019-01-22 DIAGNOSIS — F0280 Dementia in other diseases classified elsewhere without behavioral disturbance: Secondary | ICD-10-CM | POA: Diagnosis not present

## 2019-01-22 DIAGNOSIS — G308 Other Alzheimer's disease: Secondary | ICD-10-CM | POA: Diagnosis not present

## 2019-01-22 DIAGNOSIS — I10 Essential (primary) hypertension: Secondary | ICD-10-CM | POA: Diagnosis not present

## 2019-01-22 DIAGNOSIS — G309 Alzheimer's disease, unspecified: Secondary | ICD-10-CM | POA: Diagnosis not present

## 2019-01-22 DIAGNOSIS — M6281 Muscle weakness (generalized): Secondary | ICD-10-CM | POA: Diagnosis not present

## 2019-01-23 DIAGNOSIS — M25511 Pain in right shoulder: Secondary | ICD-10-CM | POA: Diagnosis not present

## 2019-01-23 DIAGNOSIS — M6281 Muscle weakness (generalized): Secondary | ICD-10-CM | POA: Diagnosis not present

## 2019-01-23 DIAGNOSIS — G309 Alzheimer's disease, unspecified: Secondary | ICD-10-CM | POA: Diagnosis not present

## 2019-01-24 DIAGNOSIS — M6281 Muscle weakness (generalized): Secondary | ICD-10-CM | POA: Diagnosis not present

## 2019-01-24 DIAGNOSIS — G309 Alzheimer's disease, unspecified: Secondary | ICD-10-CM | POA: Diagnosis not present

## 2019-01-24 DIAGNOSIS — M25511 Pain in right shoulder: Secondary | ICD-10-CM | POA: Diagnosis not present

## 2019-01-25 DIAGNOSIS — G309 Alzheimer's disease, unspecified: Secondary | ICD-10-CM | POA: Diagnosis not present

## 2019-01-25 DIAGNOSIS — M6281 Muscle weakness (generalized): Secondary | ICD-10-CM | POA: Diagnosis not present

## 2019-01-25 DIAGNOSIS — M25511 Pain in right shoulder: Secondary | ICD-10-CM | POA: Diagnosis not present

## 2019-01-26 DIAGNOSIS — M25511 Pain in right shoulder: Secondary | ICD-10-CM | POA: Diagnosis not present

## 2019-01-26 DIAGNOSIS — G309 Alzheimer's disease, unspecified: Secondary | ICD-10-CM | POA: Diagnosis not present

## 2019-01-26 DIAGNOSIS — M6281 Muscle weakness (generalized): Secondary | ICD-10-CM | POA: Diagnosis not present

## 2019-01-27 DIAGNOSIS — M6281 Muscle weakness (generalized): Secondary | ICD-10-CM | POA: Diagnosis not present

## 2019-01-27 DIAGNOSIS — M79675 Pain in left toe(s): Secondary | ICD-10-CM | POA: Diagnosis not present

## 2019-01-27 DIAGNOSIS — M25511 Pain in right shoulder: Secondary | ICD-10-CM | POA: Diagnosis not present

## 2019-01-27 DIAGNOSIS — B351 Tinea unguium: Secondary | ICD-10-CM | POA: Diagnosis not present

## 2019-01-27 DIAGNOSIS — M79674 Pain in right toe(s): Secondary | ICD-10-CM | POA: Diagnosis not present

## 2019-01-27 DIAGNOSIS — G309 Alzheimer's disease, unspecified: Secondary | ICD-10-CM | POA: Diagnosis not present

## 2019-01-29 DIAGNOSIS — M6281 Muscle weakness (generalized): Secondary | ICD-10-CM | POA: Diagnosis not present

## 2019-01-29 DIAGNOSIS — M25511 Pain in right shoulder: Secondary | ICD-10-CM | POA: Diagnosis not present

## 2019-01-29 DIAGNOSIS — G309 Alzheimer's disease, unspecified: Secondary | ICD-10-CM | POA: Diagnosis not present

## 2019-02-01 DIAGNOSIS — M6281 Muscle weakness (generalized): Secondary | ICD-10-CM | POA: Diagnosis not present

## 2019-02-01 DIAGNOSIS — M25511 Pain in right shoulder: Secondary | ICD-10-CM | POA: Diagnosis not present

## 2019-02-01 DIAGNOSIS — G309 Alzheimer's disease, unspecified: Secondary | ICD-10-CM | POA: Diagnosis not present

## 2019-02-02 DIAGNOSIS — M6281 Muscle weakness (generalized): Secondary | ICD-10-CM | POA: Diagnosis not present

## 2019-02-02 DIAGNOSIS — G309 Alzheimer's disease, unspecified: Secondary | ICD-10-CM | POA: Diagnosis not present

## 2019-02-02 DIAGNOSIS — M25511 Pain in right shoulder: Secondary | ICD-10-CM | POA: Diagnosis not present

## 2019-02-03 DIAGNOSIS — M6281 Muscle weakness (generalized): Secondary | ICD-10-CM | POA: Diagnosis not present

## 2019-02-03 DIAGNOSIS — M25511 Pain in right shoulder: Secondary | ICD-10-CM | POA: Diagnosis not present

## 2019-02-03 DIAGNOSIS — G309 Alzheimer's disease, unspecified: Secondary | ICD-10-CM | POA: Diagnosis not present

## 2019-02-05 DIAGNOSIS — M25511 Pain in right shoulder: Secondary | ICD-10-CM | POA: Diagnosis not present

## 2019-02-05 DIAGNOSIS — G309 Alzheimer's disease, unspecified: Secondary | ICD-10-CM | POA: Diagnosis not present

## 2019-02-05 DIAGNOSIS — M6281 Muscle weakness (generalized): Secondary | ICD-10-CM | POA: Diagnosis not present

## 2019-02-06 DIAGNOSIS — M25511 Pain in right shoulder: Secondary | ICD-10-CM | POA: Diagnosis not present

## 2019-02-06 DIAGNOSIS — G309 Alzheimer's disease, unspecified: Secondary | ICD-10-CM | POA: Diagnosis not present

## 2019-02-06 DIAGNOSIS — M6281 Muscle weakness (generalized): Secondary | ICD-10-CM | POA: Diagnosis not present

## 2019-02-07 DIAGNOSIS — G309 Alzheimer's disease, unspecified: Secondary | ICD-10-CM | POA: Diagnosis not present

## 2019-02-07 DIAGNOSIS — M25511 Pain in right shoulder: Secondary | ICD-10-CM | POA: Diagnosis not present

## 2019-02-07 DIAGNOSIS — M6281 Muscle weakness (generalized): Secondary | ICD-10-CM | POA: Diagnosis not present

## 2019-02-08 DIAGNOSIS — M6281 Muscle weakness (generalized): Secondary | ICD-10-CM | POA: Diagnosis not present

## 2019-02-08 DIAGNOSIS — G309 Alzheimer's disease, unspecified: Secondary | ICD-10-CM | POA: Diagnosis not present

## 2019-02-08 DIAGNOSIS — M25511 Pain in right shoulder: Secondary | ICD-10-CM | POA: Diagnosis not present

## 2019-02-09 DIAGNOSIS — M25511 Pain in right shoulder: Secondary | ICD-10-CM | POA: Diagnosis not present

## 2019-02-09 DIAGNOSIS — G309 Alzheimer's disease, unspecified: Secondary | ICD-10-CM | POA: Diagnosis not present

## 2019-02-09 DIAGNOSIS — M6281 Muscle weakness (generalized): Secondary | ICD-10-CM | POA: Diagnosis not present

## 2019-02-10 DIAGNOSIS — M25511 Pain in right shoulder: Secondary | ICD-10-CM | POA: Diagnosis not present

## 2019-02-10 DIAGNOSIS — G309 Alzheimer's disease, unspecified: Secondary | ICD-10-CM | POA: Diagnosis not present

## 2019-02-10 DIAGNOSIS — M6281 Muscle weakness (generalized): Secondary | ICD-10-CM | POA: Diagnosis not present

## 2019-02-15 DIAGNOSIS — F0280 Dementia in other diseases classified elsewhere without behavioral disturbance: Secondary | ICD-10-CM | POA: Diagnosis not present

## 2019-02-15 DIAGNOSIS — I099 Rheumatic heart disease, unspecified: Secondary | ICD-10-CM | POA: Diagnosis not present

## 2019-02-15 DIAGNOSIS — G308 Other Alzheimer's disease: Secondary | ICD-10-CM | POA: Diagnosis not present

## 2019-02-15 DIAGNOSIS — M1388 Other specified arthritis, other site: Secondary | ICD-10-CM | POA: Diagnosis not present

## 2019-03-01 DIAGNOSIS — F0280 Dementia in other diseases classified elsewhere without behavioral disturbance: Secondary | ICD-10-CM | POA: Diagnosis not present

## 2019-03-01 DIAGNOSIS — I0989 Other specified rheumatic heart diseases: Secondary | ICD-10-CM | POA: Diagnosis not present

## 2019-03-01 DIAGNOSIS — M1388 Other specified arthritis, other site: Secondary | ICD-10-CM | POA: Diagnosis not present

## 2019-03-01 DIAGNOSIS — G308 Other Alzheimer's disease: Secondary | ICD-10-CM | POA: Diagnosis not present

## 2019-03-11 DIAGNOSIS — G308 Other Alzheimer's disease: Secondary | ICD-10-CM | POA: Diagnosis not present

## 2019-03-11 DIAGNOSIS — M1388 Other specified arthritis, other site: Secondary | ICD-10-CM | POA: Diagnosis not present

## 2019-03-11 DIAGNOSIS — D6489 Other specified anemias: Secondary | ICD-10-CM | POA: Diagnosis not present

## 2019-03-11 DIAGNOSIS — F0280 Dementia in other diseases classified elsewhere without behavioral disturbance: Secondary | ICD-10-CM | POA: Diagnosis not present

## 2019-04-08 DIAGNOSIS — G308 Other Alzheimer's disease: Secondary | ICD-10-CM | POA: Diagnosis not present

## 2019-04-08 DIAGNOSIS — M1388 Other specified arthritis, other site: Secondary | ICD-10-CM | POA: Diagnosis not present

## 2019-04-08 DIAGNOSIS — D6489 Other specified anemias: Secondary | ICD-10-CM | POA: Diagnosis not present

## 2019-04-08 DIAGNOSIS — F0280 Dementia in other diseases classified elsewhere without behavioral disturbance: Secondary | ICD-10-CM | POA: Diagnosis not present

## 2019-04-09 DIAGNOSIS — I714 Abdominal aortic aneurysm, without rupture: Secondary | ICD-10-CM | POA: Diagnosis not present

## 2019-04-09 DIAGNOSIS — F0280 Dementia in other diseases classified elsewhere without behavioral disturbance: Secondary | ICD-10-CM | POA: Diagnosis not present

## 2019-04-09 DIAGNOSIS — I10 Essential (primary) hypertension: Secondary | ICD-10-CM | POA: Diagnosis not present

## 2019-04-09 DIAGNOSIS — G308 Other Alzheimer's disease: Secondary | ICD-10-CM | POA: Diagnosis not present

## 2019-04-12 DIAGNOSIS — I10 Essential (primary) hypertension: Secondary | ICD-10-CM | POA: Diagnosis not present

## 2019-04-12 DIAGNOSIS — D649 Anemia, unspecified: Secondary | ICD-10-CM | POA: Diagnosis not present

## 2019-04-12 DIAGNOSIS — N185 Chronic kidney disease, stage 5: Secondary | ICD-10-CM | POA: Diagnosis not present

## 2019-04-14 DIAGNOSIS — R278 Other lack of coordination: Secondary | ICD-10-CM | POA: Diagnosis not present

## 2019-04-14 DIAGNOSIS — G309 Alzheimer's disease, unspecified: Secondary | ICD-10-CM | POA: Diagnosis not present

## 2019-04-14 DIAGNOSIS — I10 Essential (primary) hypertension: Secondary | ICD-10-CM | POA: Diagnosis not present

## 2019-04-14 DIAGNOSIS — N185 Chronic kidney disease, stage 5: Secondary | ICD-10-CM | POA: Diagnosis not present

## 2019-04-14 DIAGNOSIS — D649 Anemia, unspecified: Secondary | ICD-10-CM | POA: Diagnosis not present

## 2019-05-06 DIAGNOSIS — J189 Pneumonia, unspecified organism: Secondary | ICD-10-CM | POA: Diagnosis not present

## 2019-05-06 DIAGNOSIS — I517 Cardiomegaly: Secondary | ICD-10-CM | POA: Diagnosis not present

## 2019-05-06 DIAGNOSIS — J90 Pleural effusion, not elsewhere classified: Secondary | ICD-10-CM | POA: Diagnosis not present

## 2019-05-07 DIAGNOSIS — M1990 Unspecified osteoarthritis, unspecified site: Secondary | ICD-10-CM | POA: Diagnosis not present

## 2019-05-07 DIAGNOSIS — J189 Pneumonia, unspecified organism: Secondary | ICD-10-CM | POA: Diagnosis not present

## 2019-05-07 DIAGNOSIS — I480 Paroxysmal atrial fibrillation: Secondary | ICD-10-CM | POA: Diagnosis not present

## 2019-05-07 DIAGNOSIS — F0280 Dementia in other diseases classified elsewhere without behavioral disturbance: Secondary | ICD-10-CM | POA: Diagnosis not present

## 2019-05-10 DIAGNOSIS — J188 Other pneumonia, unspecified organism: Secondary | ICD-10-CM | POA: Diagnosis not present

## 2019-05-10 DIAGNOSIS — M1388 Other specified arthritis, other site: Secondary | ICD-10-CM | POA: Diagnosis not present

## 2019-05-10 DIAGNOSIS — G308 Other Alzheimer's disease: Secondary | ICD-10-CM | POA: Diagnosis not present

## 2019-05-10 DIAGNOSIS — D6489 Other specified anemias: Secondary | ICD-10-CM | POA: Diagnosis not present

## 2019-05-10 DIAGNOSIS — F0280 Dementia in other diseases classified elsewhere without behavioral disturbance: Secondary | ICD-10-CM | POA: Diagnosis not present

## 2019-05-22 DEATH — deceased
